# Patient Record
Sex: Male | Born: 1970 | Race: White | Hispanic: No | Marital: Single | Smoking: Never smoker
Health system: Southern US, Community
[De-identification: ages and names within clinical notes are randomized; demographics above are authoritative.]

## PROBLEM LIST (undated history)

## (undated) DIAGNOSIS — K219 Gastro-esophageal reflux disease without esophagitis: Secondary | ICD-10-CM

## (undated) DIAGNOSIS — R519 Headache, unspecified: Secondary | ICD-10-CM

## (undated) DIAGNOSIS — I10 Essential (primary) hypertension: Secondary | ICD-10-CM

## (undated) DIAGNOSIS — E785 Hyperlipidemia, unspecified: Secondary | ICD-10-CM

## (undated) DIAGNOSIS — G4733 Obstructive sleep apnea (adult) (pediatric): Secondary | ICD-10-CM

## (undated) HISTORY — DX: Obstructive sleep apnea (adult) (pediatric): G47.33

## (undated) HISTORY — DX: Headache, unspecified: R51.9

## (undated) HISTORY — PX: NASAL SINUS SURGERY: SHX719

## (undated) HISTORY — PX: APPENDECTOMY: SHX54

## (undated) HISTORY — PX: LUMBAR FUSION: SHX111

## (undated) HISTORY — PX: OTHER SURGICAL HISTORY: SHX169

## (undated) HISTORY — DX: Hyperlipidemia, unspecified: E78.5

---

## 2000-06-12 ENCOUNTER — Encounter: Payer: Self-pay | Admitting: Emergency Medicine

## 2000-06-12 ENCOUNTER — Emergency Department (HOSPITAL_COMMUNITY): Admission: EM | Admit: 2000-06-12 | Discharge: 2000-06-13 | Payer: Self-pay | Admitting: Emergency Medicine

## 2000-10-17 ENCOUNTER — Encounter: Payer: Self-pay | Admitting: Emergency Medicine

## 2000-10-17 ENCOUNTER — Emergency Department (HOSPITAL_COMMUNITY): Admission: EM | Admit: 2000-10-17 | Discharge: 2000-10-17 | Payer: Self-pay | Admitting: Unknown Physician Specialty

## 2001-04-12 ENCOUNTER — Emergency Department (HOSPITAL_COMMUNITY): Admission: EM | Admit: 2001-04-12 | Discharge: 2001-04-13 | Payer: Self-pay | Admitting: Emergency Medicine

## 2002-12-24 ENCOUNTER — Inpatient Hospital Stay (HOSPITAL_COMMUNITY): Admission: RE | Admit: 2002-12-24 | Discharge: 2002-12-26 | Payer: Self-pay | Admitting: Neurological Surgery

## 2002-12-24 ENCOUNTER — Encounter: Payer: Self-pay | Admitting: Neurological Surgery

## 2003-01-11 ENCOUNTER — Encounter: Admission: RE | Admit: 2003-01-11 | Discharge: 2003-01-11 | Payer: Self-pay | Admitting: Neurological Surgery

## 2003-01-11 ENCOUNTER — Encounter: Payer: Self-pay | Admitting: Neurological Surgery

## 2003-02-09 ENCOUNTER — Encounter: Admission: RE | Admit: 2003-02-09 | Discharge: 2003-02-09 | Payer: Self-pay | Admitting: Neurological Surgery

## 2004-07-08 ENCOUNTER — Emergency Department (HOSPITAL_COMMUNITY): Admission: EM | Admit: 2004-07-08 | Discharge: 2004-07-08 | Payer: Self-pay | Admitting: Emergency Medicine

## 2004-08-02 ENCOUNTER — Emergency Department (HOSPITAL_COMMUNITY): Admission: EM | Admit: 2004-08-02 | Discharge: 2004-08-02 | Payer: Self-pay | Admitting: Emergency Medicine

## 2004-08-03 ENCOUNTER — Emergency Department (HOSPITAL_COMMUNITY): Admission: EM | Admit: 2004-08-03 | Discharge: 2004-08-03 | Payer: Self-pay | Admitting: Family Medicine

## 2008-02-27 ENCOUNTER — Emergency Department (HOSPITAL_COMMUNITY): Admission: EM | Admit: 2008-02-27 | Discharge: 2008-02-27 | Payer: Self-pay | Admitting: Emergency Medicine

## 2008-03-21 ENCOUNTER — Emergency Department (HOSPITAL_COMMUNITY): Admission: EM | Admit: 2008-03-21 | Discharge: 2008-03-21 | Payer: Self-pay | Admitting: Emergency Medicine

## 2008-04-05 ENCOUNTER — Emergency Department (HOSPITAL_COMMUNITY): Admission: EM | Admit: 2008-04-05 | Discharge: 2008-04-05 | Payer: Self-pay | Admitting: Emergency Medicine

## 2010-08-18 NOTE — Op Note (Signed)
NAME:  Zachary Delacruz, Zachary Delacruz                           ACCOUNT NO.:  [id number]   MEDICAL RECORD NO.:  [id number]                   PATIENT TYPE:  INP   LOCATION:  2899                                 FACILITY:  [facility]   PHYSICIAN:  Tia Alert, MD                  DATE OF BIRTH:  1970/08/26   DATE OF PROCEDURE:  12/24/2002  DATE OF DISCHARGE:                                 OPERATIVE REPORT   PREOPERATIVE DIAGNOSES:  1. Degenerative disk disease, L5-S1.  2. Lumbar disk herniation L5-S1 on the right with right S1 radiculopathy.  3. Back pain.   POSTOPERATIVE DIAGNOSES:  1. Degenerative disk disease, L5-S1.  2. Lumbar disk herniation L5-S1 on the right with right S1 radiculopathy.  3. Back pain.   PROCEDURE:  1. Lumbar laminectomy, facetectomy and foraminotomy L5-S1 and diskectomy L5-     S1 on the left.  2. Posterior lumbar interbody fusion utilizing 10 x 26 mm Tangent allograft     bone wedges and locally harvest morselized autologous bone graft mixed     with VITOSS.  3. Nonsegmental fixation utilizing pedicle screw and rod instrumentation.   SURGEON:  Tia Alert, MD   ASSISTANT:  Reinaldo Meeker, M.D.   ANESTHESIA:  General endotracheal anesthesia.   COMPLICATIONS:  None.   INDICATIONS FOR PROCEDURE:  Zachary Delacruz is a 39 year old white male who was  referred to the  neurosurgery clinic with complaints of mechanical back pain  with right lower extremity pain in an S1 distribution. His back pain was  worse than his leg pain and was mechanical in nature. He tried medical  management  for quite some time without significant relief. He  had an MRI  which showed degenerative disk disease at L5-S1 with collapse at the disk  space  and a broad-based disk  herniation para central to the right with  compression of the right S1 nerve root. I recommended posterior lumbar  interbody fusion with instrumentation. He understood the  risks, benefits  and alternatives and wished to  proceed.   DESCRIPTION OF PROCEDURE:  The patient was brought to the operating room and  after the induction of adequate general endotracheal anesthesia he was  rolled in the prone position  on the Wilson frame and all pressure points  were padded. His lumbar region was prepped with Duraprep and then draped in  the usual sterile fashion. Then 10 mL of local anesthesia was injected.   Then a dorsal midline incision was made and was carried down to the  lumbosacral fascia. The fascia was opened and taken down in a subperiosteal  fashion to expose the L5-S1 interspace bilaterally. I dissected out and  found the transverse processes of L5 and S1  bilaterally. Interoperative  fluoroscopy was used to identify the correct level  and then a combination  left Leksell rongeurs and Kerrison punches were used  to remove the spinous  process and perform complete  laminectomy, hemi fasciectomy and bilateral  foraminotomies at L4-5.   The L4 and L5 nerve roots were decompressed and followed  out into their  foramina for quite some distance. Once the decompression was complete, I  coagulated the epidural venous vasculature at L5-S1 bilaterally. He had a  large disk  herniation at L5-S1 on the left which was subannular. He also  had a calcified  osteophyte in the midline.   I incised the disk space bilaterally  and performed an initial diskectomy  with curets and a pituitary rongeur. I then used sequential distraction  plugs of the _________ [employer] System to distract the disk space up to 10 mm.  We left the distraction pin on the patient's right side, and on the left  side prepared the endplates with the rotating cutter, round scraper and 10-  mm  cutting chisel and then tapped a 10 x 26 mm Tangent allograft bone wedge  into the interspace at L5-S1 on the left.   We then prepared the interspace on the right side in the same way utilizing  the rotating cutter, round scraper, and cutting chisel. We then  packed  the  midline  with autograft saved during the decompression which was  mixed with  VITOSS. We then tapped another 10 x 26 mm Tangent allograft bone wedge into  the interspace at L5-S1 on the right side.   Once the PLIF was completed we  turned our attention to the nonsegmental  fixation. We localized the pedicle screw entry zones at L5 and S1  bilaterally  under fluoroscopic guidance. We tapped these pedicles with a 5-  5 tap, palpated each pedicle to assure no break in the cortex and then used  65 x 45 mm pedicle screws in the pedicles of L5 bilaterally  and 65 x 35 mm  screws into the sacrum bilaterally.   Once these were in position  we  used a 40 mm prebent  rod and locked these  into position  with the locking capsule and high torque device. We then  placed a separate cross link  for added stability. We irrigated with copious  amounts of Bacitracin containing solution, dried all bleeding points with  bipolar cautery and then closed the muscle and then the fascia with  interrupted #1 Vicryl. We closed the subcutaneous tissue with 2-0 and 3-0  Vicryl and closed the skin with Benzoin and Steri-Strips. A medium Hemovac  drain was placed prior to closure of the musculature.   The drapes  were removed. A sterile dressing was applied. The patient was  awakened from general anesthesia and transported  to the recovery room in  stable condition. At the end of the procedure all sponge, instrument and  needle counts were correct.                                               Tia Alert, MD    DSJ/MEDQ  D:  12/24/2002  T:  12/25/2002  Job:  [id number]

## 2011-05-22 ENCOUNTER — Encounter (HOSPITAL_COMMUNITY): Payer: Self-pay | Admitting: *Deleted

## 2011-05-22 ENCOUNTER — Emergency Department (INDEPENDENT_AMBULATORY_CARE_PROVIDER_SITE_OTHER)
Admission: EM | Admit: 2011-05-22 | Discharge: 2011-05-22 | Disposition: A | Discharge status: Home / Self Care | Payer: Self-pay | Source: Home / Self Care | Attending: Family Medicine | Admitting: Family Medicine

## 2011-05-22 DIAGNOSIS — M7710 Lateral epicondylitis, unspecified elbow: Secondary | ICD-10-CM

## 2011-05-22 HISTORY — DX: Essential (primary) hypertension: I10

## 2011-05-22 HISTORY — DX: Gastro-esophageal reflux disease without esophagitis: K21.9

## 2011-05-22 MED ORDER — HYDROCODONE-ACETAMINOPHEN 5-325 MG PO TABS: ORAL_TABLET | ORAL | Status: AC

## 2011-05-22 MED ORDER — NAPROXEN 500 MG PO TABS: 500.0000 mg | ORAL_TABLET | Freq: Two times a day (BID) | ORAL | Status: AC

## 2011-05-22 NOTE — Discharge Instructions (Signed)
This is an inflammation in one of your tendons and its insertion point. This is most commonly due to overuse. Take the medications as directed, completing the full course of naproxen daily; do not skip a dose. Use the hydrocodone ONLY as needed for pain not controlled by the naproxen, as this is an inflammatory injury. Also, you may use a lateral epicondylitis, or tennis elbow, band, 2 - 4 cm in front of your elbow, while using your arm. Also, try switching hands or duties between the two arms to prevent overuse. If symptoms persist, or worsen in any way, such as numbness, tingling, weakness or loss of grip strength, please follow up with the Orthopaedic Office listed: [employer] [facility].

## 2011-05-22 NOTE — ED Notes (Signed)
Bilateral elbow pain X 2 weeks, the left is worse.  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

## 2011-05-22 NOTE — ED Provider Notes (Signed)
History     CSN: [id number]  Arrival date & time 05/22/11  1427   First MD Initiated Contact with Patient 05/22/11 1504      Chief Complaint  Patient presents with  . Elbow Pain    (Consider location/radiation/quality/duration/timing/severity/associated sxs/prior treatment) HPI Comments: Zachary Delacruz presents for evaluation of bilateral elbow pain with the left being worse in the right. 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 He notes that the pain is greatest over the lateral epicondyle of the left elbow. He denies any numbness, tingling, or weakness.  Patient is a 41 y.o. male presenting with extremity pain. The history is provided by the patient.  Extremity Pain This is a new problem. The problem occurs constantly. The problem has not changed since onset.The symptoms are aggravated by exertion. The symptoms are relieved by nothing.    Past Medical History  Diagnosis Date  . GERD (gastroesophageal reflux disease)   . Hypertension     Past Surgical History  Procedure Date  . Appendectomy   . Lumbar fusion     History reviewed. No pertinent family history.  History  Substance Use Topics  . Smoking status: Never Smoker   . Smokeless tobacco: Not on file  . Alcohol Use: No      Review of Systems  Constitutional: Negative.   HENT: Negative.   Eyes: Negative.   Respiratory: Negative.   Cardiovascular: Negative.   Gastrointestinal: Negative.   Genitourinary: Negative.   Musculoskeletal: Positive for myalgias and arthralgias.  Skin: Negative.   Neurological: Negative.     Allergies  Reglan  Home Medications   Current Outpatient Rx  Name Route Sig Dispense Refill  . OMEPRAZOLE 40 MG PO CPDR Oral Take 40 mg by mouth daily.    Marland Kitchen HYDROCODONE-ACETAMINOPHEN 5-325 MG PO TABS  Take one to two tablets every 4 to 6 hours as needed for pain 20 tablet 0  . NAPROXEN 500 MG PO TABS Oral Take 1  tablet (500 mg total) by mouth 2 (two) times daily. 30 tablet 0    BP 132/81  Pulse 88  Temp(Src) 98 F (36.7 C) (Oral)  Resp 16  SpO2 98%  Physical Exam  Nursing note and vitals reviewed. Constitutional: He is oriented to person, place, and time. He appears well-developed and well-nourished.  HENT:  Head: Normocephalic and atraumatic.  Eyes: EOM are normal.  Neck: Normal range of motion.  Pulmonary/Chest: Effort normal.  Musculoskeletal: Normal range of motion.       Right elbow: He exhibits normal range of motion and no swelling. tenderness found. Lateral epicondyle tenderness noted.       Left elbow: He exhibits normal range of motion and no swelling. tenderness found. Lateral epicondyle tenderness noted.       Arms: Neurological: He is alert and oriented to person, place, and time.  Skin: Skin is warm and dry.  Psychiatric: His behavior is normal.    ED Course  Procedures (including critical care time)  Labs Reviewed - No data to display No results found.   1. Lateral epicondylitis (tennis elbow)       MDM  rx given for naproxen 500 mg PO BID, hydrocodone PRN, and advised to obtain tennis elbow band; [employer] provided.        Richardo Priest, MD 05/22/11 [phone]

## 2011-10-18 DIAGNOSIS — I10 Essential (primary) hypertension: Secondary | ICD-10-CM | POA: Diagnosis present

## 2012-08-11 DIAGNOSIS — K219 Gastro-esophageal reflux disease without esophagitis: Secondary | ICD-10-CM | POA: Diagnosis present

## 2013-02-09 ENCOUNTER — Emergency Department (HOSPITAL_COMMUNITY): Payer: BC Managed Care – PPO

## 2013-02-09 ENCOUNTER — Encounter (HOSPITAL_COMMUNITY): Payer: Self-pay | Admitting: Emergency Medicine

## 2013-02-09 ENCOUNTER — Emergency Department (HOSPITAL_COMMUNITY)
Admission: EM | Admit: 2013-02-09 | Discharge: 2013-02-09 | Disposition: A | Discharge status: Home / Self Care | Payer: BC Managed Care – PPO | Attending: Emergency Medicine | Admitting: Emergency Medicine

## 2013-02-09 DIAGNOSIS — K219 Gastro-esophageal reflux disease without esophagitis: Secondary | ICD-10-CM | POA: Insufficient documentation

## 2013-02-09 DIAGNOSIS — I10 Essential (primary) hypertension: Secondary | ICD-10-CM | POA: Insufficient documentation

## 2013-02-09 DIAGNOSIS — Z79899 Other long term (current) drug therapy: Secondary | ICD-10-CM | POA: Insufficient documentation

## 2013-02-09 DIAGNOSIS — R0602 Shortness of breath: Secondary | ICD-10-CM | POA: Insufficient documentation

## 2013-02-09 DIAGNOSIS — R209 Unspecified disturbances of skin sensation: Secondary | ICD-10-CM | POA: Insufficient documentation

## 2013-02-09 DIAGNOSIS — R0789 Other chest pain: Secondary | ICD-10-CM

## 2013-02-09 DIAGNOSIS — R42 Dizziness and giddiness: Secondary | ICD-10-CM | POA: Insufficient documentation

## 2013-02-09 LAB — BASIC METABOLIC PANEL
BUN: 16 mg/dL (ref 6–23)
CO2: 23 mEq/L (ref 19–32)
Creatinine, Ser: 1.03 mg/dL (ref 0.50–1.35)
Glucose, Bld: 101 mg/dL — ABNORMAL HIGH (ref 70–99)
Sodium: 139 mEq/L (ref 135–145)

## 2013-02-09 LAB — CBC
Hemoglobin: 15.8 g/dL (ref 13.0–17.0)
MCV: 87.3 fL (ref 78.0–100.0)
RDW: 13.1 % (ref 11.5–15.5)

## 2013-02-09 MED ORDER — ACETAMINOPHEN 325 MG PO TABS
975.0000 mg | ORAL_TABLET | Freq: Once | ORAL | Status: AC
  Administered 2013-02-09: 975 mg via ORAL
  Filled 2013-02-09: qty 3

## 2013-02-09 MED ORDER — HYDROCODONE-ACETAMINOPHEN 5-325 MG PO TABS: 1.0000 | ORAL_TABLET | ORAL | Status: DC | PRN

## 2013-02-09 NOTE — ED Provider Notes (Addendum)
CSN: [id number]     Arrival date & time 02/09/13  1352 History   First MD Initiated Contact with Patient 02/09/13 1531     Chief Complaint  Patient presents with  . Chest Pain  . Dizziness   (Consider location/radiation/quality/duration/timing/severity/associated sxs/prior Treatment) HPI Complains of left anterior chest pain gradual onset pleuritic in nature with tingling in his left arm onset 7:30 AM today. Pain not made worse with exertion. No sweatiness no other associated symptoms pain is slightly improved now without treatment.. Admits to mild shortness of breath lasting for a few minutes. No nausea denies dizziness no other complaint no treatment prior to coming here. Past Medical History  Diagnosis Date  . GERD (gastroesophageal reflux disease)   . Hypertension    cardiac risk factors hypertension otherwise negative Past Surgical History  Procedure Laterality Date  . Appendectomy    . Lumbar fusion     No family history on file. History  Substance Use Topics  . Smoking status: Never Smoker   . Smokeless tobacco: Not on file  . Alcohol Use: No    Review of Systems  Respiratory: Positive for shortness of breath.   Cardiovascular: Positive for chest pain.  All other systems reviewed and are negative.    Allergies  Metoclopramide hcl  Home Medications   Current Outpatient Rx  Name  Route  Sig  Dispense  Refill  . lisinopril (PRINIVIL,ZESTRIL) 20 MG tablet   Oral   Take 20 mg by mouth daily.         Marland Kitchen omeprazole (PRILOSEC) 40 MG capsule   Oral   Take 40 mg by mouth daily.          BP 123/82  Pulse 76  Temp(Src) 97.5 F (36.4 C) (Oral)  Resp 15  Wt 215 lb (97.523 kg)  SpO2 100% Physical Exam  Nursing note and vitals reviewed. Constitutional: He appears well-developed and well-nourished.  HENT:  Head: Normocephalic and atraumatic.  Eyes: Conjunctivae are normal. Pupils are equal, round, and reactive to light.  Neck: Neck supple. No tracheal  deviation present. No thyromegaly present.  Cardiovascular: Normal rate and regular rhythm.   No murmur heard. Pulmonary/Chest: Effort normal and breath sounds normal.  Chest wall is tender anteriorly. Pain is reproduced by forcible abduction of left shoulder.  Abdominal: Soft. Bowel sounds are normal. He exhibits no distension. There is no tenderness.  Musculoskeletal: Normal range of motion. He exhibits no edema and no tenderness.  Neurological: He is alert. Coordination normal.  Skin: Skin is warm and dry. No rash noted.  Psychiatric: He has a normal mood and affect.    ED Course  Procedures (including critical care time) Labs Review Labs Reviewed  CBC - Abnormal; Notable for the following:    WBC 11.7 (*)    MCHC 37.6 (*)    Platelets 147 (*)    All other components within normal limits  BASIC METABOLIC PANEL - Abnormal; Notable for the following:    Glucose, Bld 101 (*)    GFR calc non Af Amer 87 (*)    All other components within normal limits  PRO B NATRIURETIC PEPTIDE  POCT I-STAT TROPONIN I   Imaging Review Dg Chest 2 View  02/09/2013   CLINICAL DATA:  Chest pain. Dizziness.  EXAM: CHEST  2 VIEW  COMPARISON:  Chest radiograph 04/09/2012  FINDINGS: Stable cardiac and mediastinal contours. No consolidative pulmonary opacities. No pleural effusion or pneumothorax. Regional skeleton is unremarkable.  IMPRESSION: No acute cardiopulmonary  process.   Electronically Signed   By: Annia Belt M.D.   On: 02/09/2013 15:24    EKG Interpretation     Ventricular Rate:  99 PR Interval:  156 QRS Duration: 74 QT Interval:  332 QTC Calculation: 426 R Axis:   15 Text Interpretation:  Normal sinus rhythm Normal ECG No significant change since last tracing           Results for orders placed during the hospital encounter of 02/09/13  CBC      Result Value Range   WBC 11.7 (*) 4.0 - 10.5 K/uL   RBC 4.81  4.22 - 5.81 MIL/uL   Hemoglobin 15.8  13.0 - 17.0 g/dL   HCT 95.6   21.3 - 08.6 %   MCV 87.3  78.0 - 100.0 fL   MCH 32.8  26.0 - 34.0 pg   MCHC 37.6 (*) 30.0 - 36.0 g/dL   RDW 57.8  46.9 - 62.9 %   Platelets 147 (*) 150 - 400 K/uL  BASIC METABOLIC PANEL      Result Value Range   Sodium 139  135 - 145 mEq/L   Potassium 4.0  3.5 - 5.1 mEq/L   Chloride 105  96 - 112 mEq/L   CO2 23  19 - 32 mEq/L   Glucose, Bld 101 (*) 70 - 99 mg/dL   BUN 16  6 - 23 mg/dL   Creatinine, Ser 5.28  0.50 - 1.35 mg/dL   Calcium 9.7  8.4 - 41.3 mg/dL   GFR calc non Af Amer 87 (*) >90 mL/min   GFR calc Af Amer >90  >90 mL/min  PRO B NATRIURETIC PEPTIDE      Result Value Range   Pro B Natriuretic peptide (BNP) 36.5  0 - 125 pg/mL  POCT I-STAT TROPONIN I      Result Value Range   Troponin i, poc 0.00  0.00 - 0.08 ng/mL   Comment 3            Dg Chest 2 View  02/09/2013   CLINICAL DATA:  Chest pain. Dizziness.  EXAM: CHEST  2 VIEW  COMPARISON:  Chest radiograph 04/09/2012  FINDINGS: Stable cardiac and mediastinal contours. No consolidative pulmonary opacities. No pleural effusion or pneumothorax. Regional skeleton is unremarkable.  IMPRESSION: No acute cardiopulmonary process.   Electronically Signed   By: Annia Belt M.D.   On: 02/09/2013 15:24    Chest xray viewed by me MDM  No diagnosis found. Heart score  equals 1 patient is percneg.  Symptoms atypical for ACS and PE.  I felt can f/u ith PMD as out pt . Negative troponin after 6 hours of symptoms Plan prescription Norco Diagnosis atypical chest pain    Doug Sou, MD 02/09/13 1653  Doug Sou, MD 02/09/13 1736

## 2013-02-09 NOTE — ED Notes (Signed)
Pt is here with chest pain that started 0730 to left chest and radiates to left shoulder and arm.  Reports sob.  No nausea

## 2015-03-06 ENCOUNTER — Emergency Department (INDEPENDENT_AMBULATORY_CARE_PROVIDER_SITE_OTHER)
Admission: EM | Admit: 2015-03-06 | Discharge: 2015-03-06 | Disposition: A | Discharge status: Home / Self Care | Payer: BLUE CROSS/BLUE SHIELD | Source: Home / Self Care

## 2015-03-06 ENCOUNTER — Emergency Department (INDEPENDENT_AMBULATORY_CARE_PROVIDER_SITE_OTHER): Payer: BLUE CROSS/BLUE SHIELD

## 2015-03-06 ENCOUNTER — Encounter (HOSPITAL_COMMUNITY): Payer: Self-pay | Admitting: *Deleted

## 2015-03-06 DIAGNOSIS — J018 Other acute sinusitis: Secondary | ICD-10-CM

## 2015-03-06 MED ORDER — FLUTICASONE PROPIONATE 50 MCG/ACT NA SUSP: 2.0000 | Freq: Every day | NASAL | Status: DC

## 2015-03-06 MED ORDER — AMOXICILLIN-POT CLAVULANATE 875-125 MG PO TABS: 1.0000 | ORAL_TABLET | Freq: Two times a day (BID) | ORAL | Status: DC

## 2015-03-06 NOTE — Discharge Instructions (Signed)
You have a sinus infection. Take Augmentin as prescribed. Use Flonase daily for the next week. You should see improvement in the next 2-3 days. Follow-up as needed.

## 2015-03-06 NOTE — ED Notes (Signed)
Pt  Reports  Symptoms  Of    Cough  /   Congested     As  Well  As     Well    As   Drainage  And  Body  Aches   X  10  Days    Pt  Reports      Chest  Pain  When he  Coughs       Symptoms  Not releived  By otc meds

## 2015-03-06 NOTE — ED Provider Notes (Signed)
CSN: [id number]     Arrival date & time 03/06/15  1648 History   None    Chief Complaint  Patient presents with  . Cough   (Consider location/radiation/quality/duration/timing/severity/associated sxs/prior Treatment) HPI  He is a 44 year old man here for evaluation of cough. He states his symptoms started about 2 weeks ago with fever, body aches, and cough. He took some over-the-counter medications and seemed to be improving, but then worsened again. He reports body aches, worsening cough, foul-smelling nasal drainage only when he bends forward.  He also reports chest pain around his rib cage that is worsened with coughing. No nausea or vomiting. He reports subjective fevers over the last few days. He has taken his temperature and it was around 100.  Past Medical History  Diagnosis Date  . GERD (gastroesophageal reflux disease)   . Hypertension    Past Surgical History  Procedure Laterality Date  . Appendectomy    . Lumbar fusion     History reviewed. No pertinent family history. Social History  Substance Use Topics  . Smoking status: Never Smoker   . Smokeless tobacco: None  . Alcohol Use: No    Review of Systems As in history of present illness Allergies  Metoclopramide hcl  Home Medications   Prior to Admission medications   Medication Sig Start Date End Date Taking? Authorizing Provider  amoxicillin-clavulanate (AUGMENTIN) 875-125 MG tablet Take 1 tablet by mouth 2 (two) times daily. 03/06/15   Charm RingsErin J Ariellah Faust, MD  fluticasone (FLONASE) 50 MCG/ACT nasal spray Place 2 sprays into both nostrils daily. 03/06/15   Charm RingsErin J Kirin Pastorino, MD  lisinopril (PRINIVIL,ZESTRIL) 20 MG tablet Take 20 mg by mouth daily.    Historical Provider, MD  omeprazole (PRILOSEC) 40 MG capsule Take 40 mg by mouth daily.    Historical Provider, MD   Meds Ordered and Administered this Visit  Medications - No data to display  BP 132/88 mmHg  Pulse 84  Temp(Src) 98 F (36.7 C) (Oral)  SpO2 97% No data  found.   Physical Exam  Constitutional: He is oriented to person, place, and time. He appears well-developed and well-nourished. No distress.  HENT:  Mouth/Throat: No oropharyngeal exudate.  He has moderate postnasal drainage. Nasal mucosa is slightly erythematous. No sinus tenderness.  Neck: Neck supple.  Cardiovascular: Normal rate, regular rhythm and normal heart sounds.   No murmur heard. Pulmonary/Chest: Effort normal and breath sounds normal. No respiratory distress. He has no wheezes. He has no rales.  Lymphadenopathy:    He has no cervical adenopathy.  Neurological: He is alert and oriented to person, place, and time.    ED Course  Procedures (including critical care time)  Labs Review Labs Reviewed - No data to display  Imaging Review Dg Chest 2 View  03/06/2015  CLINICAL DATA:  Cough, congestion, fever, body aches x 10 days EXAM: CHEST  2 VIEW COMPARISON:  CT chest dated 10/11/2013 FINDINGS: Lungs are clear.  No pleural effusion or pneumothorax. The heart is normal in size. Mild degenerative changes of the visualized thoracolumbar spine. IMPRESSION: No evidence of acute cardiopulmonary disease. Electronically Signed   By: Charline BillsSriyesh  Krishnan M.D.   On: 03/06/2015 18:09     MDM   1. Other acute sinusitis    Treat with Augmentin and Flonase. Follow-up as needed.    Charm RingsErin J Cleatis Fandrich, MD 03/06/15 [phone]

## 2016-12-10 DIAGNOSIS — R079 Chest pain, unspecified: Secondary | ICD-10-CM

## 2016-12-10 DIAGNOSIS — I10 Essential (primary) hypertension: Secondary | ICD-10-CM

## 2016-12-10 DIAGNOSIS — R0602 Shortness of breath: Secondary | ICD-10-CM

## 2016-12-11 ENCOUNTER — Encounter (HOSPITAL_COMMUNITY): Payer: Self-pay | Admitting: Internal Medicine

## 2016-12-11 ENCOUNTER — Ambulatory Visit (HOSPITAL_COMMUNITY): Admit: 2016-12-11 | Payer: BLUE CROSS/BLUE SHIELD | Admitting: Interventional Cardiology

## 2016-12-11 ENCOUNTER — Observation Stay (HOSPITAL_COMMUNITY)
Admission: AD | Admit: 2016-12-11 | Discharge: 2016-12-12 | Disposition: A | Discharge status: Home / Self Care | Payer: Self-pay | Source: Other Acute Inpatient Hospital | Attending: Cardiovascular Disease | Admitting: Cardiovascular Disease

## 2016-12-11 ENCOUNTER — Encounter (HOSPITAL_COMMUNITY)
Admission: AD | Disposition: A | Discharge status: Home / Self Care | Payer: Self-pay | Source: Other Acute Inpatient Hospital | Attending: Cardiovascular Disease

## 2016-12-11 ENCOUNTER — Other Ambulatory Visit (HOSPITAL_COMMUNITY): Payer: Self-pay

## 2016-12-11 DIAGNOSIS — I200 Unstable angina: Secondary | ICD-10-CM

## 2016-12-11 DIAGNOSIS — I10 Essential (primary) hypertension: Secondary | ICD-10-CM

## 2016-12-11 DIAGNOSIS — I25110 Atherosclerotic heart disease of native coronary artery with unstable angina pectoris: Principal | ICD-10-CM | POA: Insufficient documentation

## 2016-12-11 DIAGNOSIS — K219 Gastro-esophageal reflux disease without esophagitis: Secondary | ICD-10-CM

## 2016-12-11 DIAGNOSIS — Z7951 Long term (current) use of inhaled steroids: Secondary | ICD-10-CM | POA: Insufficient documentation

## 2016-12-11 DIAGNOSIS — Z7982 Long term (current) use of aspirin: Secondary | ICD-10-CM | POA: Insufficient documentation

## 2016-12-11 DIAGNOSIS — I2510 Atherosclerotic heart disease of native coronary artery without angina pectoris: Secondary | ICD-10-CM

## 2016-12-11 DIAGNOSIS — R9439 Abnormal result of other cardiovascular function study: Secondary | ICD-10-CM

## 2016-12-11 HISTORY — PX: LEFT HEART CATH AND CORONARY ANGIOGRAPHY: CATH118249

## 2016-12-11 LAB — LIPID PANEL
CHOL/HDL RATIO: 5.3 ratio
CHOLESTEROL: 144 mg/dL (ref 0–200)
HDL: 27 mg/dL — ABNORMAL LOW (ref >40)
LDL Cholesterol: 81 mg/dL (ref 0–99)
TRIGLYCERIDES: 181 mg/dL — AB (ref <150)
VLDL: 36 mg/dL (ref 0–40)

## 2016-12-11 LAB — COMPREHENSIVE METABOLIC PANEL
ALK PHOS: 59 U/L (ref 38–126)
ALT: 30 U/L (ref 17–63)
AST: 28 U/L (ref 15–41)
Albumin: 3.8 g/dL (ref 3.5–5.0)
Anion gap: 8 (ref 5–15)
BUN: 11 mg/dL (ref 6–20)
CALCIUM: 8.9 mg/dL (ref 8.9–10.3)
CO2: 23 mmol/L (ref 22–32)
CREATININE: 1.1 mg/dL (ref 0.61–1.24)
Chloride: 107 mmol/L (ref 101–111)
Glucose, Bld: 98 mg/dL (ref 65–99)
Potassium: 3.7 mmol/L (ref 3.5–5.1)
Sodium: 138 mmol/L (ref 135–145)
Total Bilirubin: 1.5 mg/dL — ABNORMAL HIGH (ref 0.3–1.2)
Total Protein: 6.3 g/dL — ABNORMAL LOW (ref 6.5–8.1)

## 2016-12-11 LAB — TROPONIN I
Troponin I: <0.03 ng/mL (ref <0.03)
Troponin I: <0.03 ng/mL (ref <0.03)

## 2016-12-11 LAB — TSH: TSH: 2.926 u[IU]/mL (ref 0.350–4.500)

## 2016-12-11 LAB — CBC
HCT: 43.9 % (ref 39.0–52.0)
HEMOGLOBIN: 15.9 g/dL (ref 13.0–17.0)
MCH: 32.1 pg (ref 26.0–34.0)
MCHC: 36.2 g/dL — ABNORMAL HIGH (ref 30.0–36.0)
MCV: 88.7 fL (ref 78.0–100.0)
PLATELETS: 126 10*3/uL — AB (ref 150–400)
RBC: 4.95 MIL/uL (ref 4.22–5.81)
RDW: 13.3 % (ref 11.5–15.5)
WBC: 7 10*3/uL (ref 4.0–10.5)

## 2016-12-11 LAB — PROTIME-INR
INR: 1.04
PROTHROMBIN TIME: 13.5 s (ref 11.4–15.2)

## 2016-12-11 LAB — HIV ANTIBODY (ROUTINE TESTING W REFLEX): HIV SCREEN 4TH GENERATION: NONREACTIVE

## 2016-12-11 LAB — HEMOGLOBIN A1C
Hgb A1c MFr Bld: 4.5 % — ABNORMAL LOW (ref 4.8–5.6)
MEAN PLASMA GLUCOSE: 82.45 mg/dL

## 2016-12-11 LAB — BRAIN NATRIURETIC PEPTIDE: B NATRIURETIC PEPTIDE 5: 20.8 pg/mL (ref 0.0–100.0)

## 2016-12-11 SURGERY — LEFT HEART CATH AND CORONARY ANGIOGRAPHY
Anesthesia: LOCAL

## 2016-12-11 MED ORDER — IOPAMIDOL (ISOVUE-370) INJECTION 76%
INTRAVENOUS | Status: AC
  Filled 2016-12-11: qty 100

## 2016-12-11 MED ORDER — SODIUM CHLORIDE 0.9 % WEIGHT BASED INFUSION
1.0000 mL/kg/h | INTRAVENOUS | Status: DC
  Administered 2016-12-11: 1 mL/kg/h via INTRAVENOUS

## 2016-12-11 MED ORDER — SODIUM CHLORIDE 0.9% FLUSH: 3.0000 mL | INTRAVENOUS | Status: DC | PRN

## 2016-12-11 MED ORDER — LIDOCAINE HCL 2 % IJ SOLN
INTRAMUSCULAR | Status: AC
  Filled 2016-12-11: qty 10

## 2016-12-11 MED ORDER — ATORVASTATIN CALCIUM 20 MG PO TABS
20.0000 mg | ORAL_TABLET | Freq: Every day | ORAL | Status: DC
  Administered 2016-12-11: 20 mg via ORAL
  Filled 2016-12-11: qty 1

## 2016-12-11 MED ORDER — OXYCODONE HCL 5 MG PO TABS: 5.0000 mg | ORAL_TABLET | ORAL | Status: DC | PRN

## 2016-12-11 MED ORDER — HEPARIN SODIUM (PORCINE) 5000 UNIT/ML IJ SOLN
5000.0000 [IU] | Freq: Three times a day (TID) | INTRAMUSCULAR | Status: DC
  Administered 2016-12-11 – 2016-12-12 (×2): 5000 [IU] via SUBCUTANEOUS
  Filled 2016-12-11 (×2): qty 1

## 2016-12-11 MED ORDER — SODIUM CHLORIDE 0.9% FLUSH
3.0000 mL | Freq: Two times a day (BID) | INTRAVENOUS | Status: DC
  Administered 2016-12-11: 3 mL via INTRAVENOUS

## 2016-12-11 MED ORDER — FENTANYL CITRATE (PF) 100 MCG/2ML IJ SOLN
INTRAMUSCULAR | Status: AC
  Filled 2016-12-11: qty 2

## 2016-12-11 MED ORDER — ASPIRIN 81 MG PO CHEW: 81.0000 mg | CHEWABLE_TABLET | Freq: Every day | ORAL | Status: DC

## 2016-12-11 MED ORDER — ASPIRIN EC 81 MG PO TBEC
81.0000 mg | DELAYED_RELEASE_TABLET | Freq: Every day | ORAL | Status: DC
  Administered 2016-12-12: 81 mg via ORAL
  Filled 2016-12-11: qty 1

## 2016-12-11 MED ORDER — LISINOPRIL 20 MG PO TABS
20.0000 mg | ORAL_TABLET | Freq: Every day | ORAL | Status: DC
  Administered 2016-12-11 – 2016-12-12 (×2): 20 mg via ORAL
  Filled 2016-12-11 (×2): qty 1

## 2016-12-11 MED ORDER — SODIUM CHLORIDE 0.9 % WEIGHT BASED INFUSION
3.0000 mL/kg/h | INTRAVENOUS | Status: DC
  Administered 2016-12-11: 3 mL/kg/h via INTRAVENOUS

## 2016-12-11 MED ORDER — HEPARIN SODIUM (PORCINE) 1000 UNIT/ML IJ SOLN
INTRAMUSCULAR | Status: AC
  Filled 2016-12-11: qty 1

## 2016-12-11 MED ORDER — SODIUM CHLORIDE 0.9 % WEIGHT BASED INFUSION
1.0000 mL/kg/h | INTRAVENOUS | Status: AC
  Administered 2016-12-11: 1 mL/kg/h via INTRAVENOUS

## 2016-12-11 MED ORDER — SODIUM CHLORIDE 0.9 % IV SOLN: 250.0000 mL | INTRAVENOUS | Status: DC | PRN

## 2016-12-11 MED ORDER — ONDANSETRON HCL 4 MG/2ML IJ SOLN: 4.0000 mg | Freq: Four times a day (QID) | INTRAMUSCULAR | Status: DC | PRN

## 2016-12-11 MED ORDER — FENTANYL CITRATE (PF) 100 MCG/2ML IJ SOLN
INTRAMUSCULAR | Status: DC | PRN
Start: 2016-12-11 — End: 2016-12-11
  Administered 2016-12-11 (×2): 50 ug via INTRAVENOUS

## 2016-12-11 MED ORDER — MIDAZOLAM HCL 2 MG/2ML IJ SOLN
INTRAMUSCULAR | Status: DC | PRN
  Administered 2016-12-11 (×2): 1 mg via INTRAVENOUS

## 2016-12-11 MED ORDER — VERAPAMIL HCL 2.5 MG/ML IV SOLN
INTRAVENOUS | Status: DC | PRN
  Administered 2016-12-11: 11:00:00 via INTRA_ARTERIAL

## 2016-12-11 MED ORDER — ACETAMINOPHEN 325 MG PO TABS
650.0000 mg | ORAL_TABLET | ORAL | Status: DC | PRN
  Administered 2016-12-11: 650 mg via ORAL
  Filled 2016-12-11: qty 2

## 2016-12-11 MED ORDER — SODIUM CHLORIDE 0.9% FLUSH
3.0000 mL | Freq: Two times a day (BID) | INTRAVENOUS | Status: DC
  Administered 2016-12-11 – 2016-12-12 (×2): 3 mL via INTRAVENOUS

## 2016-12-11 MED ORDER — PANTOPRAZOLE SODIUM 40 MG PO TBEC
40.0000 mg | DELAYED_RELEASE_TABLET | Freq: Every day | ORAL | Status: DC
  Administered 2016-12-11 – 2016-12-12 (×2): 40 mg via ORAL
  Filled 2016-12-11 (×2): qty 1

## 2016-12-11 MED ORDER — FLUTICASONE PROPIONATE 50 MCG/ACT NA SUSP
2.0000 | Freq: Every day | NASAL | Status: DC
  Filled 2016-12-11: qty 16

## 2016-12-11 MED ORDER — HEPARIN SODIUM (PORCINE) 1000 UNIT/ML IJ SOLN
INTRAMUSCULAR | Status: DC | PRN
  Administered 2016-12-11: 5000 [IU] via INTRAVENOUS

## 2016-12-11 MED ORDER — AMOXICILLIN-POT CLAVULANATE 875-125 MG PO TABS: 1.0000 | ORAL_TABLET | Freq: Two times a day (BID) | ORAL | Status: DC

## 2016-12-11 MED ORDER — VERAPAMIL HCL 2.5 MG/ML IV SOLN
INTRAVENOUS | Status: AC
  Filled 2016-12-11: qty 2

## 2016-12-11 MED ORDER — HEPARIN (PORCINE) IN NACL 2-0.9 UNIT/ML-% IJ SOLN
INTRAMUSCULAR | Status: AC | PRN
  Administered 2016-12-11: 1000 mL

## 2016-12-11 MED ORDER — TRAZODONE HCL 50 MG PO TABS
25.0000 mg | ORAL_TABLET | Freq: Every evening | ORAL | Status: DC | PRN
  Administered 2016-12-11: 25 mg via ORAL
  Filled 2016-12-11: qty 1

## 2016-12-11 MED ORDER — HEPARIN (PORCINE) IN NACL 2-0.9 UNIT/ML-% IJ SOLN
INTRAMUSCULAR | Status: AC
  Filled 2016-12-11: qty 1000

## 2016-12-11 MED ORDER — LIDOCAINE HCL (PF) 1 % IJ SOLN
INTRAMUSCULAR | Status: DC | PRN
  Administered 2016-12-11: 2 mL via INTRADERMAL

## 2016-12-11 MED ORDER — IOPAMIDOL (ISOVUE-370) INJECTION 76%
INTRAVENOUS | Status: DC | PRN
  Administered 2016-12-11: 40 mL via INTRA_ARTERIAL

## 2016-12-11 MED ORDER — HEPARIN (PORCINE) IN NACL 100-0.45 UNIT/ML-% IJ SOLN
1300.0000 [IU]/h | INTRAMUSCULAR | Status: DC
  Administered 2016-12-11: 1300 [IU]/h via INTRAVENOUS
  Filled 2016-12-11: qty 250

## 2016-12-11 MED ORDER — MIDAZOLAM HCL 2 MG/2ML IJ SOLN
INTRAMUSCULAR | Status: AC
  Filled 2016-12-11: qty 2

## 2016-12-11 MED ORDER — ATORVASTATIN CALCIUM 80 MG PO TABS: 80.0000 mg | ORAL_TABLET | Freq: Every day | ORAL | Status: DC

## 2016-12-11 MED ORDER — ASPIRIN 81 MG PO CHEW
81.0000 mg | CHEWABLE_TABLET | ORAL | Status: AC
  Administered 2016-12-11: 81 mg via ORAL
  Filled 2016-12-11: qty 1

## 2016-12-11 SURGICAL SUPPLY — 12 items
CATH INFINITI 5 FR JL3.5 (CATHETERS) ×1 IMPLANT
CATH INFINITI JR4 5F (CATHETERS) ×1 IMPLANT
COVER PRB 48X5XTLSCP FOLD TPE (BAG) IMPLANT
COVER PROBE 5X48 (BAG) ×2
DEVICE RAD COMP TR BAND LRG (VASCULAR PRODUCTS) ×1 IMPLANT
GLIDESHEATH SLEND A-KIT 6F 22G (SHEATH) ×1 IMPLANT
GUIDEWIRE INQWIRE 1.5J.035X260 (WIRE) IMPLANT
INQWIRE 1.5J .035X260CM (WIRE) ×2
KIT HEART LEFT (KITS) ×2 IMPLANT
PACK CARDIAC CATHETERIZATION (CUSTOM PROCEDURE TRAY) ×2 IMPLANT
TRANSDUCER W/STOPCOCK (MISCELLANEOUS) ×2 IMPLANT
TUBING CIL FLEX 10 FLL-RA (TUBING) ×2 IMPLANT

## 2016-12-11 NOTE — Interval H&P Note (Signed)
Cath Lab Visit (complete for each Cath Lab visit)  Clinical Evaluation Leading to the Procedure:   ACS: Yes.    Non-ACS:    Anginal Classification: CCS IV  Anti-ischemic medical therapy: Minimal Therapy (1 class of medications)  Non-Invasive Test Results: Low-risk stress test findings: cardiac mortality <1%/year  Prior CABG: No previous CABG      History and Physical Interval Note:  12/11/2016 11:07 AM  Zachary Delacruz  has presented today for surgery, with the diagnosis of cp  The various methods of treatment have been discussed with the patient and family. After consideration of risks, benefits and other options for treatment, the patient has consented to  Procedure(s): LEFT HEART CATH AND CORONARY ANGIOGRAPHY (N/A) as a surgical intervention .  The patient's history has been reviewed, patient examined, no change in status, stable for surgery.  I have reviewed the patient's chart and labs.  Questions were answered to the patient's satisfaction.     Lyn RecordsHenry W Roddie Riegler III

## 2016-12-11 NOTE — CV Procedure (Signed)
   Normal coronary arteries.  Normal LV function.

## 2016-12-11 NOTE — Progress Notes (Addendum)
ANTICOAGULATION CONSULT NOTE - Initial Consult  Pharmacy Consult for heparin  Indication: chest pain/ACS  Allergies  Allergen Reactions  . Metoclopramide Hcl Other (See Comments)    tachycardia    Patient Measurements: Height: 6\' 2"  (188 cm) Weight: 222 lb 11.2 oz (101 kg) IBW/kg (Calculated) : 82.2 Heparin Dosing Weight: 101 kg   Vital Signs: Temp: 97.5 F (36.4 C) (09/11 0352) BP: 125/81 (09/11 0352) Pulse Rate: 72 (09/11 0352)  Labs: No results for input(s): HGB, HCT, PLT, APTT, LABPROT, INR, HEPARINUNFRC, HEPRLOWMOCWT, CREATININE, CKTOTAL, CKMB, TROPONINI in the last 72 hours.  CrCl cannot be calculated (Patient's most recent lab result is older than the maximum 21 days allowed.).  Medical History: Past Medical History:  Diagnosis Date  . GERD (gastroesophageal reflux disease)   . Hypertension     Assessment: 46 yo [facility] of [facility]. [facility] and patient received Lovenox 1mg /kg (100 mg) once at 0125 on 9/10.   No oral anticoagulation PTA per patient. [place] [facility] on 9/9, with Hgb 16.7 and plt 157. No s/s bleeding noted.   Will hold bolus in setting of recent therapeutic dose of lovenox.   Goal of Therapy:  Heparin level 0.3-0.7 units/ml Monitor platelets by anticoagulation protocol: Yes   Plan:  Start heparin gtt at 1300 units/hr Heparin level in 6 hrs Daily heparin level and CBC Monitor for s/s bleeding   York CeriseKatherine Cook, PharmD Clinical Pharmacist 12/11/16 6:54 AM

## 2016-12-11 NOTE — H&P (Signed)
CARDIOLOGY OBSERVATION HISTORY AND PHYSICAL EXAMINATION NOTE  Patient ID: Zachary Delacruz MRN: [id number], DOB/AGE: 48/07/1970   Admit date: 12/11/2016   Primary Physician: System, Provider Not In Primary Cardiologist: new  Reason for admission: chest pain  HPI: This is a 46 y.o. white male with no prior history of CAD  But h/o HTN and GERD presented with chest pain to [facility].  Patient developed CP 2 d ago, sudden onset, left sided, non radiating, 10/10 in intensity. He never experience similar CP before. Associated with diaphoresis. CP is persistent and did not respond to NTG. Different from hig GERD.  At [facility], CTA for PE was -ve, EKG showed NSR and LAFB. Exercise stress nuclear study showed small anteroseptal reversible defect. Patient sent here for cardiac cath. Continues to have CP. Able to perform 10.2 METs without limiting angina.  Hemodynamically stable and -ve troponin -ve EKG  Ischemic changes.   Of note, patient has been [facility] for costochondritis in [facility].   Problem List: Past Medical History:  Diagnosis Date  . GERD (gastroesophageal reflux disease)   . Hypertension     Past Surgical History:  Procedure Laterality Date  . APPENDECTOMY    . LUMBAR FUSION       Allergies:  Allergies  Allergen Reactions  . Metoclopramide Hcl Other (See Comments)    tachycardia     Home Medications Current Facility-Administered Medications  Medication Dose Route Frequency Provider Last Rate Last Dose  . aspirin EC tablet 81 mg  81 mg Oral Daily Timoteo ExposeQureshi, Waqas T, MD      . atorvastatin (LIPITOR) tablet 80 mg  80 mg Oral q1800 Timoteo ExposeQureshi, Waqas T, MD      . fluticasone [facility](FLONASE) 50 MCG/ACT nasal spray 2 spray  2 spray Each Nare Daily Timoteo ExposeQureshi, Waqas T, MD      . heparin ADULT infusion 100 units/mL (25000 units/27450mL sodium chloride 0.45%)  1,300 Units/hr Intravenous Continuous Cherre HugerCook, Katherine R, [facility]      . lisinopril (PRINIVIL,ZESTRIL) tablet 20 mg  20 mg  Oral Daily Timoteo ExposeQureshi, Waqas T, MD      . pantoprazole (PROTONIX) EC tablet 40 mg  40 mg Oral Daily Joellyn RuedQureshi, Waqas T, MD         Family History  Problem Relation Age of Onset  . Premature CHD Neg Hx      Social History   Social History  . Marital status: Single    Spouse name: N/A  . Number of children: N/A  . Years of education: N/A   Occupational History  . Not on file.   Social History Main Topics  . Smoking status: Never Smoker  . Smokeless tobacco: Not on file  . Alcohol use No  . Drug use: No  . Sexual activity: Not on file   Other Topics Concern  . Not on file   Social History Narrative  . No narrative on file     Review of Systems: General: negative for chills, fever, night sweats or weight changes.  Cardiovascular: chest pain negative for dyspnea on exertion, edema, orthopnea, palpitations, paroxysmal nocturnal dyspnea or SOB Dermatological: negative for rash Respiratory: negative for cough or wheezing Urologic: negative for hematuria Abdominal: negative for nausea, vomiting, diarrhea, bright red blood per rectum, melena, or hematemesis Neurologic: negative for visual changes, syncope, or dizziness Endocrine: no diabetes, no hypothyroidism Immunological: no lymph adenopathy Psych: non homicidal/suicidal  Physical Exam: Vitals: BP 125/81   Pulse 72   Temp (!) 97.5 F (  36.4 C)   Resp 20   Ht  (1.88 m)   Wt 101 kg (222 lb 11.2 oz)   SpO2 98%   BMI 28.59 kg/m  General: not in acute distress Neck: JVP flat, neck supple Heart: regular rate and rhythm, S1, S2, no murmurs  Lungs: CTAB  GI: non tender, non distended, bowel sounds present Extremities: no edema Neuro: AAO x 3  Psych: normal affect, no anxiety   Labs:  No results found for this or any previous visit (from the past 24 hour(s)).   Radiology/Studies: No results found.  EKG: NSR with left anterior fascicular block  Echo: pending  Nuclear exercise stress test: post stress LVEF  66%, small reversible defect in anteroseptal region, no WMA.  Performed 10.2 METS  Medical decision making:  Discussed care with the patient Discussed care with the physician on the phone Reviewed labs and imaging personally Reviewed prior records  ASSESSMENT AND PLAN:  This is a 46 y.o. male with no prior history of CAD but has significant h/o HTN and GERD presented with persistent CP.    Principal Problem:   Unstable angina (HCC) Active Problems:   Hypertension, benign   Esophageal reflux   Unstable angina  CP at rest, persistent with -ve troponin. Heart score 4, +ve stress in LAD territory (low to intermediate risk). Able to perform 10.2 METs with exercise without limiting angina.   - started on IV heparin b/c ongonig CP, cycle troponin, echo in AM, NPO post midnight for possible cath in the AM. Lipid panel ordered.  - If -ve cardiac cath then consider intensifying GERD therapy with increasing antacid dosage or possibly treat for esophageal spasm with long acting nitrate/CCB  Hypertension, continue lisinopril  GERD - continue antacid.   Signed, Joellyn Rued, MD MS 12/11/2016, 7:12 AM

## 2016-12-11 NOTE — Progress Notes (Signed)
Pt admitted with chest pain from [facility]. Abnormal nuclear stress test with anteroseptal defect. He has negative troponin. He has ongoing chest pain this am. I am not certain that his pain is from obstructive CAD. No evidence of ACS but with ongoing moderately severe chest pain, will plan cardiac cath this am. Labs ok. Creatinine and H/H reviewed. Will place cath orders. Risks and benefits reviewed.   Verne CarrowChristopher Rosabel Sermeno 12/11/2016 7:55 AM

## 2016-12-12 ENCOUNTER — Observation Stay (HOSPITAL_BASED_OUTPATIENT_CLINIC_OR_DEPARTMENT_OTHER): Payer: Self-pay

## 2016-12-12 ENCOUNTER — Ambulatory Visit: Payer: BLUE CROSS/BLUE SHIELD | Admitting: Cardiology

## 2016-12-12 ENCOUNTER — Emergency Department (HOSPITAL_COMMUNITY)
Admission: EM | Admit: 2016-12-12 | Discharge: 2016-12-12 | Disposition: A | Discharge status: Home / Self Care | Payer: Self-pay | Attending: Emergency Medicine | Admitting: Emergency Medicine

## 2016-12-12 ENCOUNTER — Encounter (HOSPITAL_COMMUNITY): Payer: Self-pay | Admitting: *Deleted

## 2016-12-12 ENCOUNTER — Telehealth: Payer: Self-pay | Admitting: Cardiovascular Disease

## 2016-12-12 DIAGNOSIS — R072 Precordial pain: Secondary | ICD-10-CM

## 2016-12-12 DIAGNOSIS — Z9889 Other specified postprocedural states: Secondary | ICD-10-CM | POA: Insufficient documentation

## 2016-12-12 DIAGNOSIS — I361 Nonrheumatic tricuspid (valve) insufficiency: Secondary | ICD-10-CM

## 2016-12-12 DIAGNOSIS — Z5321 Procedure and treatment not carried out due to patient leaving prior to being seen by health care provider: Secondary | ICD-10-CM | POA: Insufficient documentation

## 2016-12-12 LAB — ECHOCARDIOGRAM COMPLETE
HEIGHTINCHES: 74 in
Weight: 3598.4 oz

## 2016-12-12 LAB — CBC
HCT: 45.8 % (ref 39.0–52.0)
Hemoglobin: 16.5 g/dL (ref 13.0–17.0)
MCH: 32 pg (ref 26.0–34.0)
MCHC: 36 g/dL (ref 30.0–36.0)
MCV: 88.9 fL (ref 78.0–100.0)
PLATELETS: 150 10*3/uL (ref 150–400)
RBC: 5.15 MIL/uL (ref 4.22–5.81)
RDW: 13.2 % (ref 11.5–15.5)
WBC: 8.4 10*3/uL (ref 4.0–10.5)

## 2016-12-12 LAB — BASIC METABOLIC PANEL
Anion gap: 6 (ref 5–15)
BUN: 9 mg/dL (ref 6–20)
CALCIUM: 9.6 mg/dL (ref 8.9–10.3)
CO2: 26 mmol/L (ref 22–32)
Chloride: 106 mmol/L (ref 101–111)
Creatinine, Ser: 1.1 mg/dL (ref 0.61–1.24)
GFR calc Af Amer: >60 mL/min (ref >60)
GLUCOSE: 129 mg/dL — AB (ref 65–99)
Potassium: 3.9 mmol/L (ref 3.5–5.1)
Sodium: 138 mmol/L (ref 135–145)

## 2016-12-12 MED ORDER — ATORVASTATIN CALCIUM 20 MG PO TABS: 20.0000 mg | ORAL_TABLET | Freq: Every day | ORAL | 0 refills | Status: DC

## 2016-12-12 MED ORDER — ASPIRIN 81 MG PO TBEC: 81.0000 mg | DELAYED_RELEASE_TABLET | Freq: Every day | ORAL | Status: DC

## 2016-12-12 NOTE — ED Notes (Signed)
Spoke with Dr. Jacqulyn BathLong about patient and he just recommended cbc and bmet.

## 2016-12-12 NOTE — Discharge Summary (Signed)
Discharge Summary    Patient ID: Zachary Delacruz,  MRN: [id number], DOB/AGE: May 04, 1970 46 y.o.  Admit date: 12/11/2016 Discharge date: 12/12/2016  Primary Care Provider: System, Provider Not In Primary Cardiologist: [facility]   Discharge Diagnoses    Principal Problem:   Unstable angina [facility]) Active Problems:   Hypertension, benign   Esophageal reflux   Abnormal nuclear stress test   Allergies Allergies  Allergen Reactions  . Metoclopramide Hcl Other (See Comments)    tachycardia    Diagnostic Studies/Procedures    LHC: 12/11/16  Conclusion    Mid and distal LAD eccentric luminal irregularities up to 30% narrowing.  Widely patent coronaries with no angiographically significant obstruction noted.  Normal left ventricular function and filling pressures. EF 60%.  RECOMMENDATIONS:   No obstructive coronary disease to account for the patient's symptoms.   TTE: 12/12/16  Study Conclusions  - Left ventricle: The cavity size was normal. Systolic function was   vigorous. The estimated ejection fraction was in the range of 65%   to 70%. Wall motion was normal; there were no regional wall   motion abnormalities. There was no evidence of elevated   ventricular filling pressure by Doppler parameters. - Tricuspid valve: There was mild regurgitation. - Pulmonary arteries: Systolic pressure was mildly increased. PA   peak pressure: 33 mm Hg (S). _____________   History of Present Illness     46 yo male with PMH of HTN and GERD who presented to [facility] hospital with chest pain. Underwent CTA that was negative for PE and EKG showed NSR. Underwent a stress test that showed an anteroseptal reversible defect. Given this finding he was sent to [facility] for further work up with cardiac cath.   Hospital Course      Underwent LHC with Dr. Katrinka Blazing noted above with no CAD and normal EF/LVEDP. Post cath labs were stable. Follow up echo showed normal EF with no WMA. No further  episodes of chest pain.   General: Well developed, well nourished, male appearing in no acute distress. Head: Normocephalic, atraumatic.  Neck: Supple without bruits, JVD. Lungs:  Resp regular and unlabored, CTA. Heart: RRR, S1, S2, no S3, S4, or murmur; no rub. Abdomen: Soft, non-tender, non-distended with normoactive bowel sounds. No hepatomegaly. No rebound/guarding. No obvious abdominal masses. Extremities: No clubbing, cyanosis, edema. Distal pedal pulses are 2+ bilaterally. R radial cath site stable without bruising or hematoma Neuro: Alert and oriented X 3. Moves all extremities spontaneously. Psych: Normal affect.  Zachary Delacruz was seen by Dr. Clifton James and determined stable for discharge home. Follow up in the office has been arranged. Medications are listed below.   _____________  Discharge Vitals Blood pressure 130/81, pulse 82, temperature 97.9 F (36.6 C), temperature source Oral, resp. rate 18, height  (1.88 m), weight 224 lb 14.4 oz (102 kg), SpO2 98 %.  Filed Weights   12/11/16 0352 12/11/16 0838 12/12/16 0500  Weight: 222 lb 11.2 oz (101 kg) 222 lb 11.2 oz (101 kg) 224 lb 14.4 oz (102 kg)    Labs & Radiologic Studies    CBC  Recent Labs  12/11/16 0646  WBC 7.0  HGB 15.9  HCT 43.9  MCV 88.7  PLT 126*   Basic Metabolic Panel  Recent Labs  12/11/16 0646  NA 138  K 3.7  CL 107  CO2 23  GLUCOSE 98  BUN 11  CREATININE 1.10  CALCIUM 8.9   Liver Function Tests  Recent Labs  12/11/16 0646  AST 28  ALT 30  ALKPHOS 59  BILITOT 1.5*  PROT 6.3*  ALBUMIN 3.8   No results for input(s): LIPASE, AMYLASE in the last 72 hours. Cardiac Enzymes  Recent Labs  12/11/16 0646 12/11/16 1226 12/11/16 1838  TROPONINI <0.03 <0.03 <0.03   BNP Invalid input(s): POCBNP D-Dimer No results for input(s): DDIMER in the last 72 hours. Hemoglobin A1C  Recent Labs  12/11/16 0639  HGBA1C 4.5*   Fasting Lipid Panel  Recent Labs  12/11/16 0639    CHOL 144  HDL 27*  LDLCALC 81  TRIG 161181*  CHOLHDL 5.3   Thyroid Function Tests  Recent Labs  12/11/16 0639  TSH 2.926   _____________  No results found. Disposition   Pt is being discharged home today in good condition.  Follow-up Plans & Appointments    Follow-up Information    Kathleene HazelMcAlhany, Bethel Gaglio D, MD Follow up.   Specialty:  Cardiology Why:  The office will call for your follow up appt.  Contact information: [address] [place] [address] [phone]          Discharge Instructions    Call MD for:  redness, tenderness, or signs of infection (pain, swelling, redness, odor or green/yellow discharge around incision site)    Complete by:  As directed    Diet - low sodium heart healthy    Complete by:  As directed    Discharge instructions    Complete by:  As directed    Radial Site Care Refer to this sheet in the next few weeks. These instructions provide you with information on caring for yourself after your procedure. Your caregiver may also give you more specific instructions. Your treatment has been planned according to current medical practices, but problems sometimes occur. Call your caregiver if you have any problems or questions after your procedure. HOME CARE INSTRUCTIONS You may shower the day after the procedure.Remove the bandage (dressing) and gently wash the site with plain soap and water.Gently pat the site dry.  Do not apply powder or lotion to the site.  Do not submerge the affected site in water for 3 to 5 days.  Inspect the site at least twice daily.  Do not flex or bend the affected arm for 24 hours.  No lifting over 5 pounds (2.3 kg) for 5 days after your procedure.  Do not drive home if you are discharged the same day of the procedure. Have someone else drive you.  You may drive 24 hours after the procedure unless otherwise instructed by your caregiver.  What to expect: Any bruising will usually fade within 1 to 2  weeks.  Blood that collects in the tissue (hematoma) may be painful to the touch. It should usually decrease in size and tenderness within 1 to 2 weeks.  SEEK IMMEDIATE MEDICAL CARE IF: You have unusual pain at the radial site.  You have redness, warmth, swelling, or pain at the radial site.  You have drainage (other than a small amount of blood on the dressing).  You have chills.  You have a fever or persistent symptoms for more than 72 hours.  You have a fever and your symptoms suddenly get worse.  Your arm becomes pale, cool, tingly, or numb.  You have heavy bleeding from the site. Hold pressure on the site.   Increase activity slowly    Complete by:  As directed       Discharge Medications  Medication List    TAKE these medications   aspirin 81 MG EC tablet Take 1 tablet (81 mg total) by mouth daily.   atorvastatin 20 MG tablet Commonly known as:  LIPITOR Take 1 tablet (20 mg total) by mouth daily at 6 PM.   fluticasone 50 MCG/ACT nasal spray Commonly known as:  FLONASE Place 2 sprays into both nostrils daily.   lisinopril 10 MG tablet Commonly known as:  PRINIVIL,ZESTRIL Take 10 mg by mouth daily.   omeprazole 40 MG capsule Commonly known as:  PRILOSEC Take 40 mg by mouth daily.         Outstanding Labs/Studies   FLP/LFTs in 6 weeks.   Duration of Discharge Encounter   Greater than 30 minutes including physician time.  Signed, Laverda Page NP-C 12/12/2016, 1:20 PM   See my full note this am. cdm

## 2016-12-12 NOTE — Telephone Encounter (Signed)
Spoke with pt and is complaining of right thumb and hand tingling radiating up the arm no other symptoms no raised area,drainiage discoloration ,or temp change .Discussed with Dr Eldridge DaceVaranasi have pt apply warm compress and continue to monitor.Iif s/s worsen then needs to go to er for eval and tx.Pt agrees ./cy

## 2016-12-12 NOTE — Telephone Encounter (Signed)
°  New Message   pt verbalized that he is calling for rn   He had a cath done and he is feeling   From his thumb to his forearm and its numb and tingly   It started in the last 20 minutes

## 2016-12-12 NOTE — ED Notes (Signed)
Pt requesting to leave; informed pt to follow up with cardiologist at soon as possible due to numbness

## 2016-12-12 NOTE — Care Management Note (Signed)
Case Management Note  Patient Details  Name: Rico JunkerJohn M Wittner MRN: [id number] Date of Birth: 11/09/1970  Subjective/Objective:  Pt presented for Unstable Angina. Pt is in between jobs and he has no insurance at this time. Pt has PCP [facility]egina York [facility]. Pt states he is able to afford current medications. Pt uses [facility]- CM did make him aware of the [facility] $4.00 Medication List.   Action/Plan: No further needs from CM at this time.   Expected Discharge Date:  12/12/16               Expected Discharge Plan:  Home/Self Care  In-House Referral:  NA  Discharge planning Services  CM Consult  Post Acute Care Choice:  NA Choice offered to:  NA  DME Arranged:  N/A DME Agency:  NA  HH Arranged:  NA HH Agency:  NA  Status of Service:  Completed, signed off  If discussed at Long Length of Stay Meetings, dates discussed:    Additional Comments:  Gala LewandowskyGraves-Bigelow, Ebone Alcivar Kaye, RN 12/12/2016, 1:05 PM

## 2016-12-12 NOTE — Progress Notes (Signed)
  Echocardiogram 2D Echocardiogram has been performed.  Leta JunglingCooper, Cherrise Occhipinti M 12/12/2016, 11:04 AM

## 2016-12-12 NOTE — Progress Notes (Signed)
Progress Note  Patient Name: Zachary JunkerJohn M Delacruz Date of Encounter: 12/12/2016  Primary Cardiologist: Clifton JamesMcAlhany  Subjective   Chest pain resolved. No dyspnea  Inpatient Medications    Scheduled Meds: . aspirin EC  81 mg Oral Daily  . atorvastatin  20 mg Oral q1800  . fluticasone  2 spray Each Nare Daily  . heparin  5,000 Units Subcutaneous Q8H  . lisinopril  20 mg Oral Daily  . pantoprazole  40 mg Oral Daily  . sodium chloride flush  3 mL Intravenous Q12H   Continuous Infusions: . sodium chloride     PRN Meds: sodium chloride, acetaminophen, ondansetron (ZOFRAN) IV, oxyCODONE, sodium chloride flush, traZODone   Vital Signs    Vitals:   12/11/16 2004 12/11/16 2358 12/12/16 0500 12/12/16 0738  BP: 112/81 108/67 115/76 122/88  Pulse: 75 73 74 68  Resp:  19 18 18   Temp:  97.9 F (36.6 C) 98 F (36.7 C) 98.1 F (36.7 C)  TempSrc:    Oral  SpO2:  95% 96% 97%  Weight:   224 lb 14.4 oz (102 kg)   Height:        Intake/Output Summary (Last 24 hours) at 12/12/16 1058 Last data filed at 12/12/16 0900  Gross per 24 hour  Intake          1192.17 ml  Output              250 ml  Net           942.17 ml   Filed Weights   12/11/16 0352 12/11/16 0838 12/12/16 0500  Weight: 222 lb 11.2 oz (101 kg) 222 lb 11.2 oz (101 kg) 224 lb 14.4 oz (102 kg)    Telemetry    sinus - Personally Reviewed  ECG    - Personally Reviewed  Physical Exam   GEN: No acute distress.   Neck: No JVD Cardiac: RRR, no murmurs, rubs, or gallops.  Respiratory: Clear to auscultation bilaterally. GI: Soft, nontender, non-distended  MS: No edema; No deformity. Neuro:  Nonfocal  Psych: Normal affect   Labs    Chemistry Recent Labs Lab 12/11/16 0646  NA 138  K 3.7  CL 107  CO2 23  GLUCOSE 98  BUN 11  CREATININE 1.10  CALCIUM 8.9  PROT 6.3*  ALBUMIN 3.8  AST 28  ALT 30  ALKPHOS 59  BILITOT 1.5*  GFRNONAA >60  GFRAA >60  ANIONGAP 8     Hematology Recent Labs Lab  12/11/16 0646  WBC 7.0  RBC 4.95  HGB 15.9  HCT 43.9  MCV 88.7  MCH 32.1  MCHC 36.2*  RDW 13.3  PLT 126*    Cardiac Enzymes Recent Labs Lab 12/11/16 0646 12/11/16 1226 12/11/16 1838  TROPONINI <0.03 <0.03 <0.03   No results for input(s): TROPIPOC in the last 168 hours.   BNP Recent Labs Lab 12/11/16 0639  BNP 20.8     DDimer No results for input(s): DDIMER in the last 168 hours.   Radiology    No results found.  Cardiac Studies   Cardiac cath 12/11/16:  Mid and distal LAD eccentric luminal irregularities up to 30% narrowing.  Widely patent coronaries with no angiographically significant obstruction noted.  Normal left ventricular function and filling pressures. EF 60%.  Patient Profile     46 y.o. male with h/o HTN and GERD admitted with chest pain and found to have mild CAD by cath on 12/11/16  Assessment & Plan  1. CAD/chest pain: Negative troponin. Mild disease by cath 12/11/16. Will continue statin and ASA. Echo pending this am. If echo is ok, can d/c home today. He can f/u with me in 2-3 weeks.     For questions or updates, please contact [facility] Please consult [email or web address] for contact info under Cardiology/STEMI.      Signed, Verne Carrow, MD  12/12/2016, 10:58 AM

## 2016-12-12 NOTE — ED Triage Notes (Addendum)
Pt had heart cath yesterday and they used his right radial artery for access. Then today he was at store and started having numbness in right thumb that spread up his arm and now over his hand.  No change in grip strength, no pain. Pt has strong right radial pulse

## 2016-12-13 MED FILL — Lidocaine HCl Local Inj 2%: INTRAMUSCULAR | Qty: 10 | Status: AC

## 2017-01-11 ENCOUNTER — Encounter: Payer: Self-pay | Admitting: *Deleted

## 2017-01-28 ENCOUNTER — Ambulatory Visit: Payer: Self-pay | Admitting: Cardiology

## 2017-04-30 IMAGING — DX DG CHEST 2V
3 series · 3 of 3 positions shown · non-contrast
Comparison: CT chest dated 10/11/2013

CLINICAL DATA: Cough, congestion, fever, body aches x 10 days

EXAM:
CHEST  2 VIEW

[chest pa (1 of 2)]
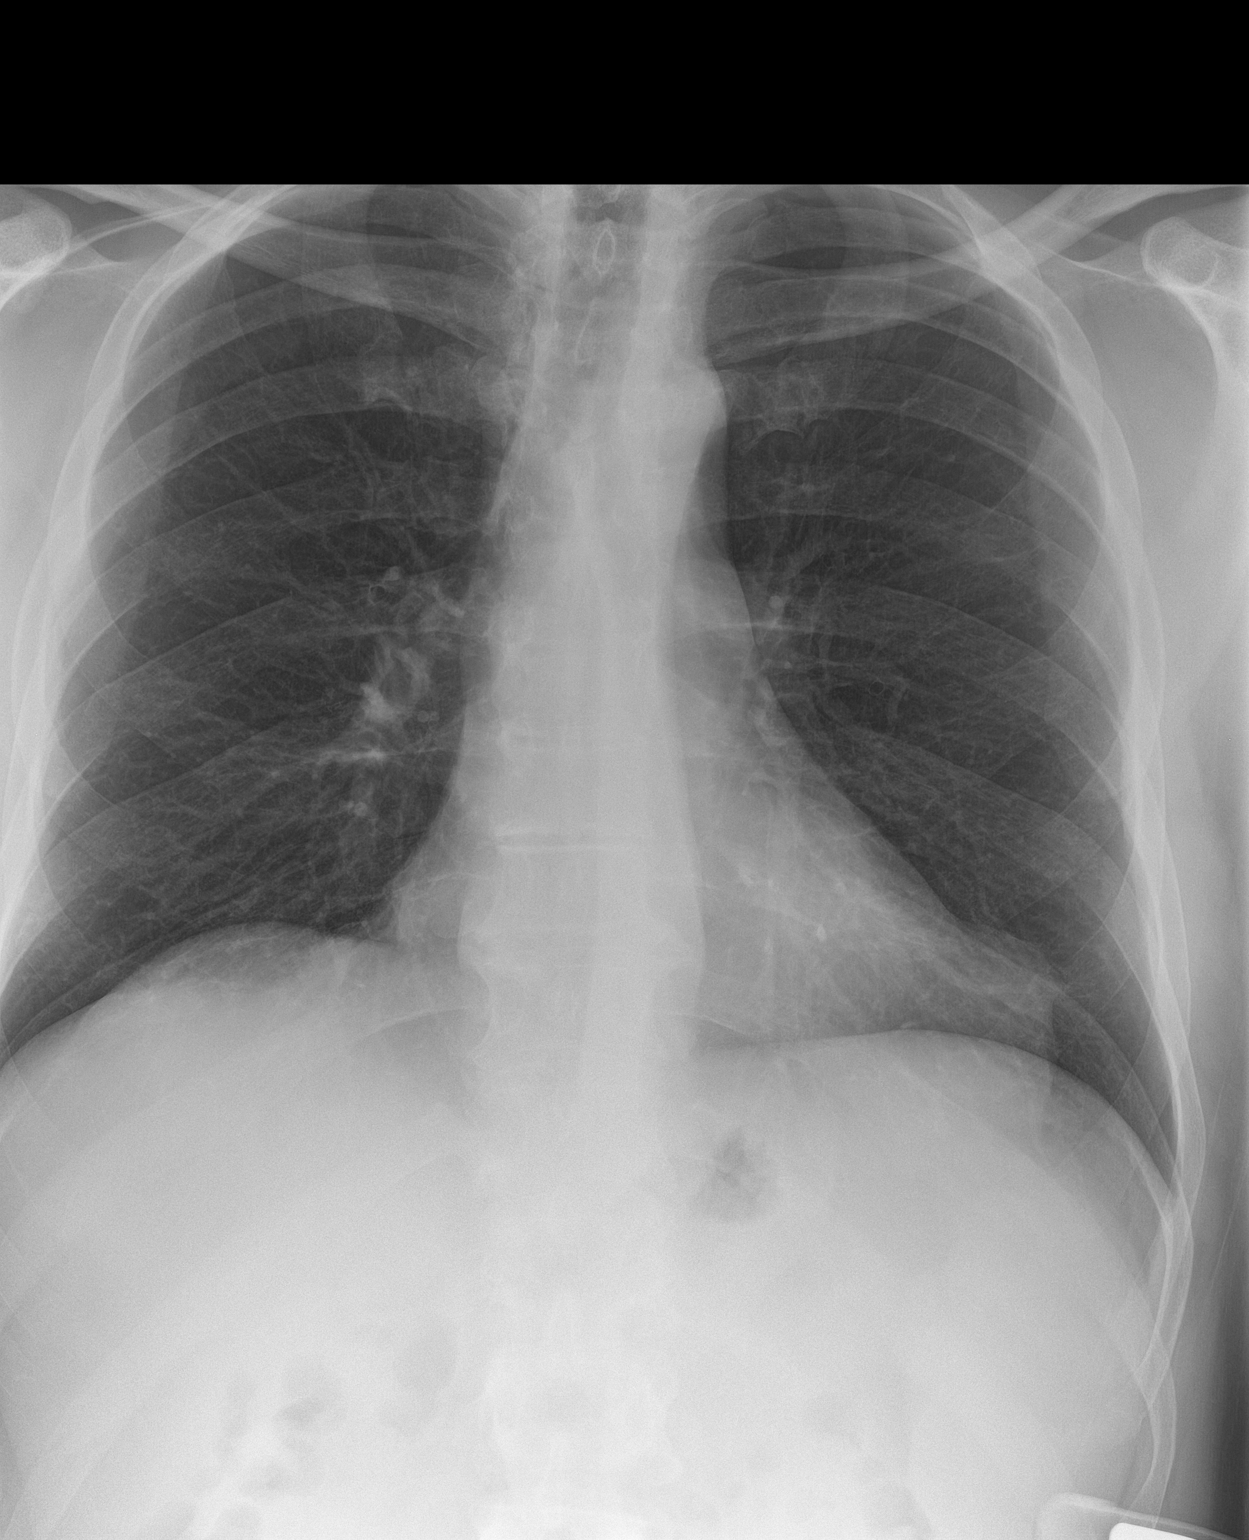

[chest pa (2 of 2)]
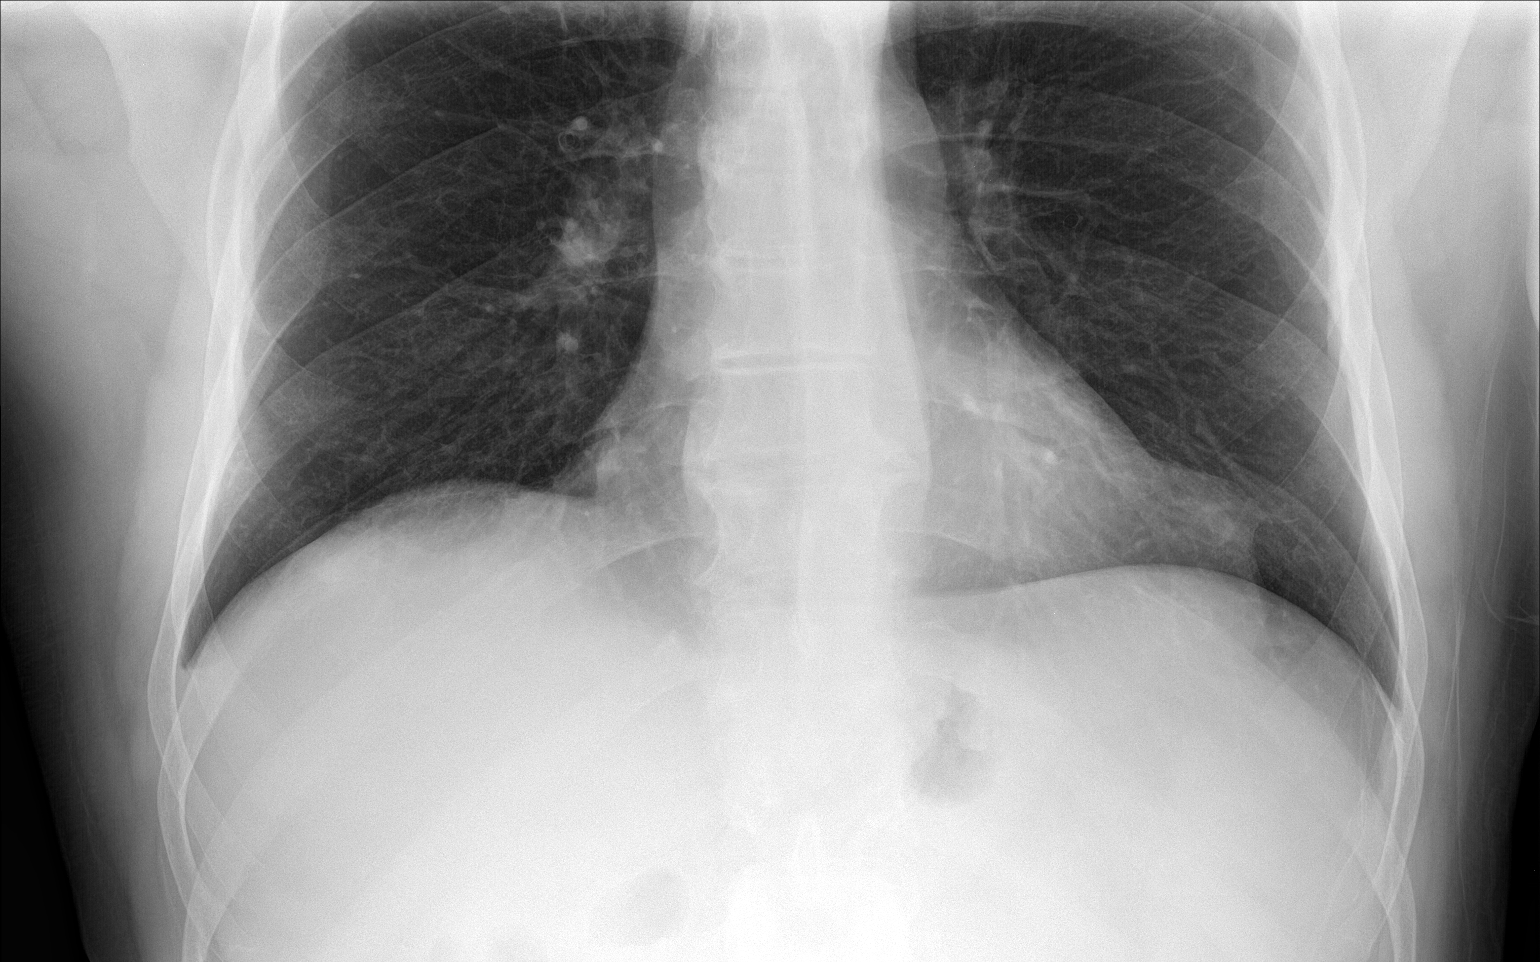

[chest lat]
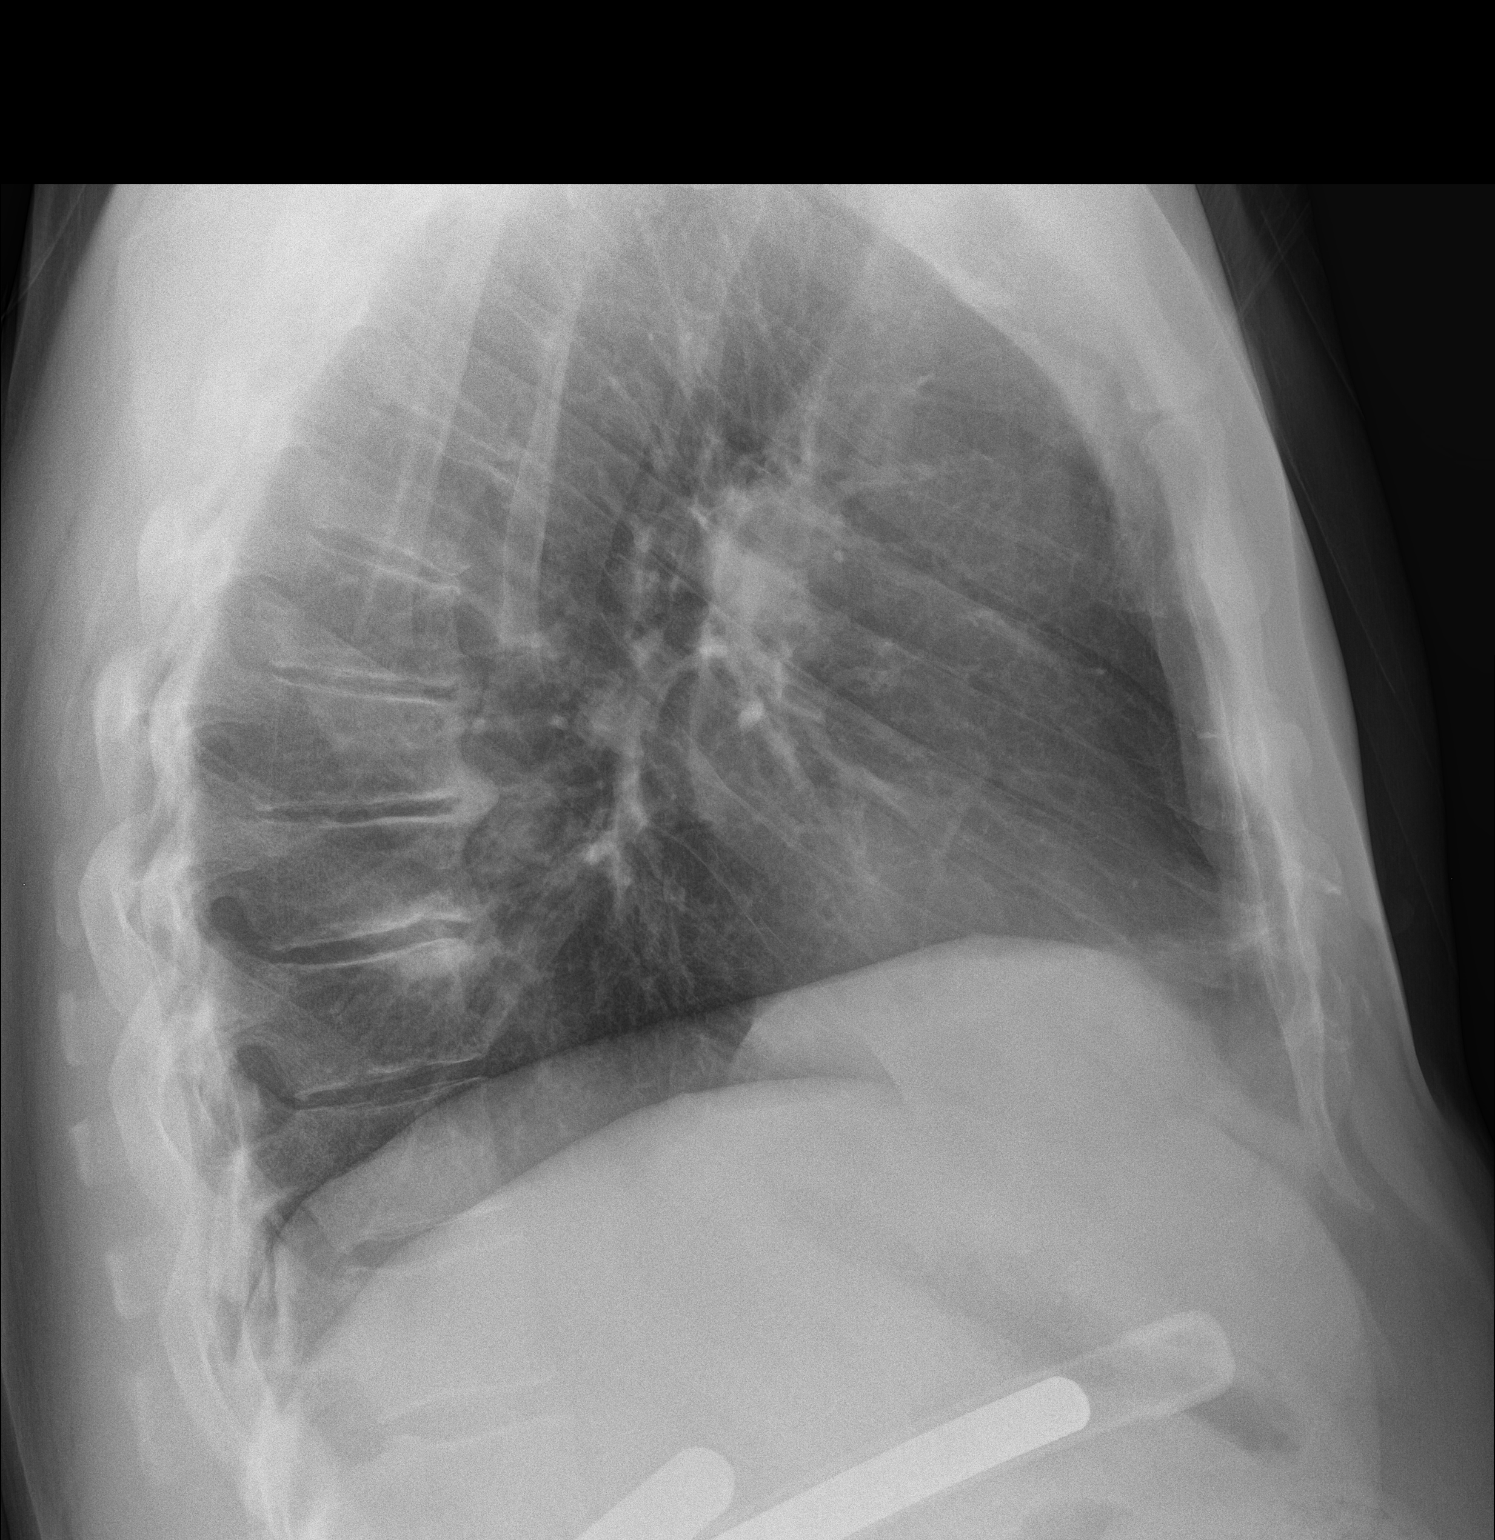

[3 of 3 positions shown; findings below may reference images not displayed]

FINDINGS: Lungs are clear.  No pleural effusion or pneumothorax.

The heart is normal in size.

Mild degenerative changes of the visualized thoracolumbar spine.
IMPRESSION: No evidence of acute cardiopulmonary disease.

## 2017-08-01 DIAGNOSIS — K760 Fatty (change of) liver, not elsewhere classified: Secondary | ICD-10-CM | POA: Diagnosis not present

## 2017-08-01 DIAGNOSIS — Z862 Personal history of diseases of the blood and blood-forming organs and certain disorders involving the immune mechanism: Secondary | ICD-10-CM | POA: Diagnosis not present

## 2017-08-01 DIAGNOSIS — R161 Splenomegaly, not elsewhere classified: Secondary | ICD-10-CM | POA: Diagnosis not present

## 2018-11-19 DIAGNOSIS — E785 Hyperlipidemia, unspecified: Secondary | ICD-10-CM

## 2018-11-19 DIAGNOSIS — R51 Headache: Secondary | ICD-10-CM

## 2018-11-19 DIAGNOSIS — I10 Essential (primary) hypertension: Secondary | ICD-10-CM

## 2018-11-19 DIAGNOSIS — R200 Anesthesia of skin: Secondary | ICD-10-CM

## 2018-11-20 DIAGNOSIS — I10 Essential (primary) hypertension: Secondary | ICD-10-CM

## 2019-01-05 ENCOUNTER — Encounter: Payer: Self-pay | Admitting: *Deleted

## 2019-01-06 ENCOUNTER — Encounter: Payer: Self-pay | Admitting: Neurology

## 2019-01-06 ENCOUNTER — Ambulatory Visit (INDEPENDENT_AMBULATORY_CARE_PROVIDER_SITE_OTHER): Payer: 59 | Admitting: Neurology

## 2019-01-06 ENCOUNTER — Other Ambulatory Visit: Payer: Self-pay

## 2019-01-06 VITALS — BP 128/82 | HR 80 | Temp 97.5°F | Ht 74.0 in | Wt 226.4 lb

## 2019-01-06 DIAGNOSIS — R519 Headache, unspecified: Secondary | ICD-10-CM | POA: Insufficient documentation

## 2019-01-06 MED ORDER — NORTRIPTYLINE HCL 10 MG PO CAPS: 20.0000 mg | ORAL_CAPSULE | Freq: Every day | ORAL | 11 refills | Status: DC

## 2019-01-06 NOTE — Progress Notes (Signed)
PATIENT: Zachary JunkerJohn M Barno DOB: 06/11/1970  Chief Complaint  Patient presents with  . Headache    Reports headaches nearly every day.  He will somtime use OTC NSAIDS with rare improvement.  He has never been on preventive medications.  He had a work-up for stroke at the end of August because of a headache with left arm numbness.   . PCP    Dema SeverinYork, Regina F, NP     HISTORICAL  Zachary Delacruz is a 48 year old male, seen in request by his primary care nurse practitioner Mauricio Poegina York for evaluation of headache, initial evaluation was January 06, 2019.  I have reviewed and summarized the referring note from the referring physician.  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  MRI of the brain showed no significant abnormality.  Laboratory evaluation showed CBC with elevated WBC of 13.9, hemoglobin of 17.4, normal CMP, creatinine of 1.1, LDL 126, cholesterol 182.  REVIEW OF SYSTEMS: Full 14 system review of systems performed and notable only for as above All other review of systems were negative.  ALLERGIES: Allergies  Allergen Reactions  . Lisinopril     Other reaction(s): Other (see comments) Cough  . Metoclopramide Hcl Other (See Comments)    tachycardia    HOME MEDICATIONS: Current Outpatient Medications  Medication Sig Dispense Refill  . amLODipine (NORVASC) 5 MG tablet Take 10 mg by mouth daily.     Marland Kitchen. aspirin EC 81 MG EC tablet Take 1 tablet (81 mg total) by mouth daily.    . fluticasone (FLONASE) 50  MCG/ACT nasal spray Place 2 sprays into both nostrils daily. 16 g 0  . hydrochlorothiazide (MICROZIDE) 12.5 MG capsule Take 12.5 mg by mouth daily.    Marland Kitchen. losartan (COZAAR) 25 MG tablet Take 25 mg by mouth daily.    Marland Kitchen. omeprazole (PRILOSEC) 20 MG capsule Take 20 mg by mouth daily.     No current facility-administered medications for this visit.     PAST MEDICAL HISTORY: Past Medical History:  Diagnosis Date  . GERD (gastroesophageal reflux disease)   . Headache   . Hyperlipemia   . Hypertension   . OSA (obstructive sleep apnea)     PAST SURGICAL HISTORY: Past Surgical History:  Procedure Laterality Date  . APPENDECTOMY    . cataract surgery    . LEFT HEART CATH AND CORONARY ANGIOGRAPHY N/A 12/11/2016   Procedure: LEFT HEART CATH AND CORONARY ANGIOGRAPHY;  Surgeon: Lyn RecordsSmith, Henry W, MD;  Location: [facility];  Service: Cardiovascular;  Laterality: N/A;  . LUMBAR FUSION    . NASAL SINUS SURGERY    . right knee surgery    . throat biopsy      FAMILY HISTORY: Family History  Problem Relation Age of Onset  . Healthy Mother   . Cirrhosis Father        died at age 50  . COPD Father   . Premature CHD Neg Hx     SOCIAL HISTORY: Social History   Socioeconomic History  .  Marital status: Single    Spouse name: Not on file  . Number of children: 4  . Years of education: 61  . Highest education level: High school graduate  Occupational History  . Occupation: Probation officer  . Financial resource strain: Not on file  . Food insecurity    Worry: Not on file    Inability: Not on file  . Transportation needs    Medical: Not on file    Non-medical: Not on file  Tobacco Use  . Smoking status: Never Smoker  . Smokeless tobacco: Never Used  Substance and Sexual Activity  . Alcohol use: Yes    Comment: rarely  . Drug use: No  . Sexual activity: Not on file  Lifestyle  . Physical activity    Days per week: Not on file    Minutes per session: Not on file  .  Stress: Not on file  Relationships  . Social Musician on phone: Not on file    Gets together: Not on file    Attends religious service: Not on file    Active member of club or organization: Not on file    Attends meetings of clubs or organizations: Not on file    Relationship status: Not on file  . Intimate partner violence    Fear of current or ex partner: Not on file    Emotionally abused: Not on file    Physically abused: Not on file    Forced sexual activity: Not on file  Other Topics Concern  . Not on file  Social History Narrative   Lives at home with his significant other, Ulyses Amor.   Right-handed.   One cup caffeine per day.     PHYSICAL EXAM   Vitals:   01/06/19 0822  BP: 128/82  Pulse: 80  Temp: (!) 97.5 F (36.4 C)  Weight: 226 lb 6.4 oz (102.7 kg)  Height: 6\' 2"  (1.88 m)    Not recorded      Body mass index is 29.07 kg/m.  PHYSICAL EXAMNIATION:  Gen: NAD, conversant, well nourised, well groomed                     Cardiovascular: Regular rate rhythm, no peripheral edema, warm, nontender. Eyes: Conjunctivae clear without exudates or hemorrhage Neck: Supple, no carotid bruits. Pulmonary: Clear to auscultation bilaterally   NEUROLOGICAL EXAM:  MENTAL STATUS: Speech:    Speech is normal; fluent and spontaneous with normal comprehension.  Cognition:     Orientation to time, place and person     Normal recent and remote memory     Normal Attention span and concentration     Normal Language, naming, repeating,spontaneous speech     Fund of knowledge   CRANIAL NERVES: CN II: Visual fields are full to confrontation. pupils are round equal and briskly reactive to light. CN III, IV, VI: extraocular movement are normal. No ptosis. CN V: Facial sensation is intact to pinprick in all 3 divisions bilaterally. Corneal responses are intact.  CN VII: Face is symmetric with normal eye closure and smile. CN VIII: Hearing is normal to causal  conversation. CN IX, X: Palate elevates symmetrically. Phonation is normal. CN XI: Head turning and shoulder shrug are intact CN XII: Tongue is midline with normal movements and no atrophy.  MOTOR: There is no pronator drift of out-stretched arms. Muscle bulk and tone are normal. Muscle strength is normal.  REFLEXES: Reflexes are 2+  and symmetric at the biceps, triceps, knees, and ankles. Plantar responses are flexor.  SENSORY: Intact to light touch, pinprick, positional sensation and vibratory sensation are intact in fingers and toes.  COORDINATION: Rapid alternating movements and fine finger movements are intact. There is no dysmetria on finger-to-nose and heel-knee-shin.    GAIT/STANCE: Posture is normal. Gait is steady with normal steps, base, arm swing, and turning. Heel and toe walking are normal. Tandem gait is normal.  Romberg is absent.   DIAGNOSTIC DATA (LABS, IMAGING, TESTING) - I reviewed patient records, labs, notes, testing and imaging myself where available.   ASSESSMENT AND PLAN  TRIGO WINTERBOTTOM is a 48 y.o. 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 Ph.D.  [facility] [address], [place], [place] [address] Ph: [phone] Fax: [phone]  CC: Referring Provider

## 2019-03-20 DIAGNOSIS — I361 Nonrheumatic tricuspid (valve) insufficiency: Secondary | ICD-10-CM

## 2019-03-31 ENCOUNTER — Telehealth: Payer: Self-pay

## 2019-03-31 NOTE — Telephone Encounter (Signed)
Called pt to r/s their appt. NP Butler Denmark has a Dr.Appt. Pts states they will call back and R/s.

## 2019-04-08 ENCOUNTER — Ambulatory Visit: Payer: Self-pay | Admitting: Neurology

## 2019-06-17 ENCOUNTER — Telehealth: Payer: Self-pay | Admitting: Physician Assistant

## 2019-06-17 NOTE — Telephone Encounter (Signed)
Called to discuss with Zachary Delacruz about Covid symptoms and the use of bamlanivimab, a monoclonal antibody infusion for those with mild to moderate Covid symptoms and at a high risk of hospitalization.     Pt is not qualified for this infusion due to lack of identified risk factors and co-morbid conditions.  Symptoms reviewed as well as criteria for ending isolation.  Symptoms reviewed that would warrant ED/Hospital evaluation as well should her condition worsen. Preventative practices reviewed. Patient verbalized understanding.  Patient Active Problem List   Diagnosis Date Noted  . Nonintractable headache 01/06/2019  . Unstable angina (HCC) 12/11/2016  . Abnormal nuclear stress test   . Esophageal reflux 08/11/2012  . Hypertension, benign 10/18/2011     Manson Passey, PA

## 2021-01-02 DIAGNOSIS — R1011 Right upper quadrant pain: Secondary | ICD-10-CM | POA: Diagnosis not present

## 2021-01-10 ENCOUNTER — Encounter: Payer: Self-pay | Admitting: Gastroenterology

## 2021-02-03 DIAGNOSIS — K828 Other specified diseases of gallbladder: Secondary | ICD-10-CM | POA: Diagnosis not present

## 2021-02-06 DIAGNOSIS — K828 Other specified diseases of gallbladder: Secondary | ICD-10-CM | POA: Diagnosis not present

## 2021-02-06 DIAGNOSIS — K811 Chronic cholecystitis: Secondary | ICD-10-CM | POA: Diagnosis not present

## 2021-02-06 DIAGNOSIS — K8020 Calculus of gallbladder without cholecystitis without obstruction: Secondary | ICD-10-CM | POA: Diagnosis not present

## 2021-02-06 DIAGNOSIS — G4730 Sleep apnea, unspecified: Secondary | ICD-10-CM | POA: Diagnosis not present

## 2021-02-06 DIAGNOSIS — I10 Essential (primary) hypertension: Secondary | ICD-10-CM | POA: Diagnosis not present

## 2021-02-06 DIAGNOSIS — K219 Gastro-esophageal reflux disease without esophagitis: Secondary | ICD-10-CM | POA: Diagnosis not present

## 2021-03-14 DIAGNOSIS — N433 Hydrocele, unspecified: Secondary | ICD-10-CM | POA: Diagnosis not present

## 2021-03-14 DIAGNOSIS — I861 Scrotal varices: Secondary | ICD-10-CM | POA: Diagnosis not present

## 2021-03-14 DIAGNOSIS — N50811 Right testicular pain: Secondary | ICD-10-CM | POA: Diagnosis not present

## 2021-04-21 DIAGNOSIS — N401 Enlarged prostate with lower urinary tract symptoms: Secondary | ICD-10-CM | POA: Diagnosis not present

## 2021-04-21 DIAGNOSIS — N50819 Testicular pain, unspecified: Secondary | ICD-10-CM | POA: Diagnosis not present

## 2021-05-18 DIAGNOSIS — J069 Acute upper respiratory infection, unspecified: Secondary | ICD-10-CM | POA: Diagnosis not present

## 2021-05-25 DIAGNOSIS — R059 Cough, unspecified: Secondary | ICD-10-CM | POA: Diagnosis not present

## 2021-05-25 DIAGNOSIS — Z6830 Body mass index (BMI) 30.0-30.9, adult: Secondary | ICD-10-CM | POA: Diagnosis not present

## 2021-05-25 DIAGNOSIS — J22 Unspecified acute lower respiratory infection: Secondary | ICD-10-CM | POA: Diagnosis not present

## 2021-05-25 DIAGNOSIS — Z79899 Other long term (current) drug therapy: Secondary | ICD-10-CM | POA: Diagnosis not present

## 2021-06-13 ENCOUNTER — Encounter: Payer: Self-pay | Admitting: Pulmonary Disease

## 2021-06-13 ENCOUNTER — Other Ambulatory Visit: Payer: Self-pay

## 2021-06-13 ENCOUNTER — Ambulatory Visit: Payer: BC Managed Care – PPO | Admitting: Pulmonary Disease

## 2021-06-13 VITALS — BP 124/86 | HR 74 | Ht 74.0 in | Wt 233.8 lb

## 2021-06-13 DIAGNOSIS — J302 Other seasonal allergic rhinitis: Secondary | ICD-10-CM

## 2021-06-13 DIAGNOSIS — J4520 Mild intermittent asthma, uncomplicated: Secondary | ICD-10-CM

## 2021-06-13 MED ORDER — LEVALBUTEROL TARTRATE 45 MCG/ACT IN AERO: 1.0000 | INHALATION_SPRAY | Freq: Four times a day (QID) | RESPIRATORY_TRACT | 12 refills | Status: AC | PRN

## 2021-06-13 MED ORDER — FLUTICASONE PROPIONATE 50 MCG/ACT NA SUSP: 1.0000 | Freq: Every day | NASAL | 2 refills | Status: AC

## 2021-06-13 NOTE — Progress Notes (Signed)
? ?Synopsis: Referred in March 2023 for asthma, self referral ? ?Subjective:  ? ?PATIENT ID: Zachary Delacruz GENDER: male DOB: [birth year], MRN: [id number] ? ?HPI ? ?Chief Complaint  ?Patient presents with  ? Consult  ?  Self referral for history of asthma and frequent URI. Last URI was about 3 weeks ago. Has noticed an increase in SOB over the past few months.   ? ?Zachary Delacruz is a 51 year old male, never smoker with GERD, OSA and hypertension who is referred to pulmonary clinic for asthma.  ? ?He reports being diagnosed with asthma a few years ago.  He has noticed shortness of breath when bending over to tie his shoes and also with exertional activities such as this cough.  He reports having to stop playing disc golf a couple times due to the shortness of breath.  He reports having to upper respiratory infections at least twice per year with significant sinus congestion and postnasal drainage.  Outside of these episodes of respiratory infections he has low baseline sinus congestion and drainage and intermittent shortness of breath.  Reports cough and wheezing during acute infections.  Denies any nighttime awakenings due to cough, shortness of breath or wheezing. ? ?He was most recently treated for upper respiratory infection with prednisone and azithromycin about 3 weeks ago. ? ?He has been evaluated by Dr. Blenda Nicely of [facility] pulmonology previously and was trialed on Trelegy Ellipta 1 puff daily and albuterol inhaler as needed.  He has not taken Trelegy in the past year and he reports albuterol inhaler makes him feel anxious/jittery. ? ?He has history of GERD which is well controlled on daily omeprazole. ? ?He is a never smoker but reports significant secondhand smoke from his parents in childhood.  His sister has asthma since childhood and is currently followed at [facility] pulmonary.  His father had COPD/emphysema.  He works as an Probation officer. ? ?Past Medical History:  ?Diagnosis Date  ? GERD (gastroesophageal reflux  disease)   ? Headache   ? Hyperlipemia   ? Hypertension   ? OSA (obstructive sleep apnea)   ?  ? ?Family History  ?Problem Relation Age of Onset  ? Healthy Mother   ? Cirrhosis Father   ?     died at age 51  ? COPD Father   ? Asthma Sister   ? Premature CHD Neg Hx   ?  ? ?Social History  ? ?Socioeconomic History  ? Marital status: Single  ?  Spouse name: Not on file  ? Number of children: 4  ? Years of education: 50  ? Highest education level: High school graduate  ?Occupational History  ? Occupation: Building surveyor  ?Tobacco Use  ? Smoking status: Never  ?  Passive exposure: Past  ? Smokeless tobacco: Never  ?Substance and Sexual Activity  ? Alcohol use: Yes  ?  Comment: rarely  ? Drug use: No  ? Sexual activity: Not on file  ?Other Topics Concern  ? Not on file  ?Social History Narrative  ? Lives at home with his significant other, Ulyses Amor.  ? Right-handed.  ? One cup caffeine per day.  ? ?Social Determinants of Health  ? ?Financial Resource Strain: Not on file  ?Food Insecurity: Not on file  ?Transportation Needs: Not on file  ?Physical Activity: Not on file  ?Stress: Not on file  ?Social Connections: Not on file  ?Intimate Partner Violence: Not on file  ?  ? ?Allergies  ?Allergen Reactions  ? Lisinopril   ?  Other reaction(s): Other (see comments) ?Cough  ? Metoclopramide Hcl Other (See Comments)  ?  tachycardia  ?  ? ?Outpatient Medications Prior to Visit  ?Medication Sig Dispense Refill  ? olmesartan (BENICAR) 20 MG tablet Take 20 mg by mouth daily.    ? omeprazole (PRILOSEC) 20 MG capsule Take 20 mg by mouth daily.    ? amLODipine (NORVASC) 5 MG tablet Take 10 mg by mouth daily.     ? aspirin EC 81 MG EC tablet Take 1 tablet (81 mg total) by mouth daily.    ? fluticasone (FLONASE) 50 MCG/ACT nasal spray Place 2 sprays into both nostrils daily. 16 g 0  ? hydrochlorothiazide (MICROZIDE) 12.5 MG capsule Take 12.5 mg by mouth daily.    ? losartan (COZAAR) 25 MG tablet Take 25 mg by mouth daily.    ?  nortriptyline (PAMELOR) 10 MG capsule Take 2 capsules (20 mg total) by mouth at bedtime. 60 capsule 11  ? ?No facility-administered medications prior to visit.  ? ?Review of Systems  ?Constitutional:  Negative for chills, fever, malaise/fatigue and weight loss.  ?HENT:  Positive for congestion. Negative for sinus pain and sore throat.   ?Eyes: Negative.   ?Respiratory:  Positive for cough, sputum production and shortness of breath. Negative for hemoptysis and wheezing.   ?Cardiovascular:  Negative for chest pain, palpitations, orthopnea, claudication and leg swelling.  ?Gastrointestinal:  Negative for abdominal pain, heartburn, nausea and vomiting.  ?Genitourinary: Negative.   ?Musculoskeletal:  Negative for joint pain and myalgias.  ?Skin:  Negative for rash.  ?Neurological:  Positive for headaches. Negative for weakness.  ?Endo/Heme/Allergies: Negative.   ?Psychiatric/Behavioral: Negative.    ? ?Objective:  ? ?Vitals:  ? 06/13/21 1544  ?BP: 124/86  ?Pulse: 74  ?SpO2: 96%  ?Weight: 233 lb 12.8 oz (106.1 kg)  ?Height: 6\' 2"  (1.88 m)  ? ? ? ?Physical Exam ?Constitutional:   ?   General: He is not in acute distress. ?HENT:  ?   Head: Normocephalic and atraumatic.  ?Eyes:  ?   Extraocular Movements: Extraocular movements intact.  ?   Conjunctiva/sclera: Conjunctivae normal.  ?   Pupils: Pupils are equal, round, and reactive to light.  ?Cardiovascular:  ?   Rate and Rhythm: Normal rate and regular rhythm.  ?   Pulses: Normal pulses.  ?   Heart sounds: Normal heart sounds. No murmur heard. ?Pulmonary:  ?   Effort: Pulmonary effort is normal.  ?   Breath sounds: Normal breath sounds.  ?Abdominal:  ?   General: Bowel sounds are normal.  ?   Palpations: Abdomen is soft.  ?Musculoskeletal:  ?   Right lower leg: No edema.  ?   Left lower leg: No edema.  ?Lymphadenopathy:  ?   Cervical: No cervical adenopathy.  ?Skin: ?   General: Skin is warm and dry.  ?Neurological:  ?   General: No focal deficit present.  ?   Mental Status:  He is alert.  ?Psychiatric:     ?   Mood and Affect: Mood normal.     ?   Behavior: Behavior normal.     ?   Thought Content: Thought content normal.     ?   Judgment: Judgment normal.  ? ? ?CBC ?   ?Component Value Date/Time  ? WBC 8.4 12/12/2016 1830  ? RBC 5.15 12/12/2016 1830  ? HGB 16.5 12/12/2016 1830  ? HCT 45.8 12/12/2016 1830  ? PLT 150 12/12/2016 1830  ? MCV  88.9 12/12/2016 1830  ? MCH 32.0 12/12/2016 1830  ? MCHC 36.0 12/12/2016 1830  ? RDW 13.2 12/12/2016 1830  ? ?Chest imaging: ?CXR 03/2015 ?No evidence of acute cardiopulmonary disease. ? ?PFT: ?No flowsheet data found. ? ?Labs: ? ?Path: ? ?Echo: ? ?Heart Catheterization: ? ?Assessment & Plan:  ? ?Mild intermittent reactive airway disease without complication - Plan: levalbuterol (XOPENEX HFA) 45 MCG/ACT inhaler, Pulmonary Function Test ? ?Seasonal allergic rhinitis, unspecified trigger - Plan: fluticasone (FLONASE) 50 MCG/ACT nasal spray ? ?Discussion: ?Jabron Weese is a 51 year old male, never smoker with GERD, OSA and hypertension who is referred to pulmonary clinic for asthma.  ? ?He appears to have more significant sinus disease involvement at this time that could be a trigger to his reactive airways.  He does have significant secondhand smoke in childhood which does predispose him to reactive airways disease/asthma. ? ?Take Zyrtec or Allegra daily along with fluticasone nasal spray 1 to 2 sprays per nostril daily for his rhinitis and postnasal drainage.  I have sent in prescription of Xopenex due to his intolerance of albuterol that he can use as needed for cough, wheezing or shortness of breath. ? ?We will check pulmonary function tests at follow-up in 4 to 6 weeks. ? ?Melody Comas, MD ?[facility] ?Office: [phone] ? ? ?Current Outpatient Medications:  ?  fluticasone (FLONASE) 50 MCG/ACT nasal spray, Place 1 spray into both nostrils daily., Disp: 16 g, Rfl: 2 ?  levalbuterol (XOPENEX HFA) 45 MCG/ACT inhaler, Inhale  1-2 puffs into the lungs every 6 (six) hours as needed for wheezing., Disp: 1 each, Rfl: 12 ?  olmesartan (BENICAR) 20 MG tablet, Take 20 mg by mouth daily., Disp: , Rfl:  ?  omeprazole (PRILOSEC) 20 MG capsule,

## 2021-06-13 NOTE — Patient Instructions (Addendum)
Use xopenex inhaler 1-2 puffs every 4-6 hours for cough, wheezing, shortness of breath.  ? ?Use fluticasone nasal spray, 1-2 sprays per nostril daily ? ?Start zyrtec or allegra allergy medicine daily ? ?We will check pulmonary function tests in 4-6 weeks ? ?   ? ? ?

## 2021-09-01 ENCOUNTER — Other Ambulatory Visit (INDEPENDENT_AMBULATORY_CARE_PROVIDER_SITE_OTHER): Payer: BC Managed Care – PPO

## 2021-09-01 ENCOUNTER — Encounter: Payer: Self-pay | Admitting: Pulmonary Disease

## 2021-09-01 ENCOUNTER — Ambulatory Visit (INDEPENDENT_AMBULATORY_CARE_PROVIDER_SITE_OTHER): Payer: BC Managed Care – PPO | Admitting: Pulmonary Disease

## 2021-09-01 ENCOUNTER — Ambulatory Visit: Payer: BC Managed Care – PPO | Admitting: Pulmonary Disease

## 2021-09-01 VITALS — BP 118/84 | HR 92 | Ht 74.0 in | Wt 234.2 lb

## 2021-09-01 DIAGNOSIS — R942 Abnormal results of pulmonary function studies: Secondary | ICD-10-CM

## 2021-09-01 DIAGNOSIS — M2550 Pain in unspecified joint: Secondary | ICD-10-CM

## 2021-09-01 DIAGNOSIS — J984 Other disorders of lung: Secondary | ICD-10-CM

## 2021-09-01 DIAGNOSIS — J4520 Mild intermittent asthma, uncomplicated: Secondary | ICD-10-CM

## 2021-09-01 LAB — COMPREHENSIVE METABOLIC PANEL
ALT: 28 U/L (ref 0–53)
AST: 27 U/L (ref 0–37)
Albumin: 4.7 g/dL (ref 3.5–5.2)
Alkaline Phosphatase: 79 U/L (ref 39–117)
BUN: 17 mg/dL (ref 6–23)
CO2: 25 mEq/L (ref 19–32)
Calcium: 9.5 mg/dL (ref 8.4–10.5)
Chloride: 104 mEq/L (ref 96–112)
Creatinine, Ser: 1.2 mg/dL (ref 0.40–1.50)
GFR: 69.37 mL/min (ref >60.00)
Glucose, Bld: 138 mg/dL — ABNORMAL HIGH (ref 70–99)
Potassium: 3.4 mEq/L — ABNORMAL LOW (ref 3.5–5.1)
Sodium: 138 mEq/L (ref 135–145)
Total Bilirubin: 1.5 mg/dL — ABNORMAL HIGH (ref 0.2–1.2)
Total Protein: 7.6 g/dL (ref 6.0–8.3)

## 2021-09-01 LAB — PULMONARY FUNCTION TEST
DL/VA % pred: 88 %
DL/VA: 3.83 ml/min/mmHg/L
DLCO cor % pred: 68 %
DLCO cor: 22.5 ml/min/mmHg
DLCO unc % pred: 68 %
DLCO unc: 22.5 ml/min/mmHg
FEF 25-75 Post: 4.52 L/sec
FEF 25-75 Pre: 4.05 L/sec
FEF2575-%Change-Post: 11 %
FEF2575-%Pred-Post: 120 %
FEF2575-%Pred-Pre: 107 %
FEV1-%Change-Post: 1 %
FEV1-%Pred-Post: 76 %
FEV1-%Pred-Pre: 74 %
FEV1-Post: 3.36 L
FEV1-Pre: 3.29 L
FEV1FVC-%Change-Post: 2 %
FEV1FVC-%Pred-Pre: 109 %
FEV6-%Change-Post: -1 %
FEV6-%Pred-Post: 69 %
FEV6-%Pred-Pre: 70 %
FEV6-Post: 3.82 L
FEV6-Pre: 3.9 L
FEV6FVC-%Change-Post: 0 %
FEV6FVC-%Pred-Post: 102 %
FEV6FVC-%Pred-Pre: 103 %
FVC-%Change-Post: 0 %
FVC-%Pred-Post: 67 %
FVC-%Pred-Pre: 68 %
FVC-Post: 3.86 L
FVC-Pre: 3.9 L
Post FEV1/FVC ratio: 87 %
Post FEV6/FVC ratio: 99 %
Pre FEV1/FVC ratio: 84 %
Pre FEV6/FVC Ratio: 100 %
RV % pred: 61 %
RV: 1.41 L
TLC % pred: 71 %
TLC: 5.59 L

## 2021-09-01 MED ORDER — TRELEGY ELLIPTA 100-62.5-25 MCG/ACT IN AEPB: 1.0000 | INHALATION_SPRAY | Freq: Every day | RESPIRATORY_TRACT | 0 refills | Status: DC

## 2021-09-01 NOTE — Progress Notes (Signed)
Synopsis: Referred in March 2023 for asthma, self referral  Subjective:   PATIENT ID: Zachary Delacruz: male DOB: 03-07-1971, MRN: [id number]  HPI  Chief Complaint  Patient presents with   Follow-up    F/U after PFT. States his breathing has been stable since last visit.    Zachary Delacruz is a 51 year old male, never smoker with GERD, OSA and hypertension who returns to pulmonary clinic for shortness of breath.   He started using fluticasone nasal spray at last visit and PRN xopenex for mild intermittent wheezing. He has used the inhaler 3-4 times since last visit. He reports his symptoms remain similar to last visit.  PFTs today show mild restriction and mild diffusion defect.  He does complain of diffuse joint aches that wax and wane, which has been over the last few years. He reports he can have joint pains involving his knees, hands and shoulders at various times.   No family history of autoimmune disease.   Initial OV 06/13/21 He reports being diagnosed with asthma a few years ago.  He has noticed shortness of breath when bending over to tie his shoes and also with exertional activities such as this cough.  He reports having to stop playing disc golf a couple times due to the shortness of breath.  He reports having to upper respiratory infections at least twice per year with significant sinus congestion and postnasal drainage.  Outside of these episodes of respiratory infections he has low baseline sinus congestion and drainage and intermittent shortness of breath.  Reports cough and wheezing during acute infections.  Denies any nighttime awakenings due to cough, shortness of breath or wheezing.  He was most recently treated for upper respiratory infection with prednisone and azithromycin about 3 weeks ago.  He has been evaluated by Dr. Alcide Clever of [facility] pulmonology previously and was trialed on Trelegy Ellipta 1 puff daily and albuterol inhaler as needed.  He has not taken Trelegy  in the past year and he reports albuterol inhaler makes him feel anxious/jittery.  He has history of GERD which is well controlled on daily omeprazole.  He is a never smoker but reports significant secondhand smoke from his parents in childhood.  His sister has asthma since childhood and is currently followed at [facility] pulmonary.  His father had COPD/emphysema.  He works as an Animal nutritionist.  Past Medical History:  Diagnosis Date   GERD (gastroesophageal reflux disease)    Headache    Hyperlipemia    Hypertension    OSA (obstructive sleep apnea)      Family History  Problem Relation Age of Onset   Healthy Mother    Cirrhosis Father        died at age 40   COPD Father    Asthma Sister    Premature CHD Neg Hx      Social History   Socioeconomic History   Marital status: Single    Spouse name: Not on file   Number of children: 4   Years of education: 12   Highest education level: High school graduate  Occupational History   Occupation: Archivist  Tobacco Use   Smoking status: Never    Passive exposure: Past   Smokeless tobacco: Never  Substance and Sexual Activity   Alcohol use: Yes    Comment: rarely   Drug use: No   Sexual activity: Not on file  Other Topics Concern   Not on file  Social History Narrative   Lives  at home with his significant other, Bynum Bellows.   Right-handed.   One cup caffeine per day.   Social Determinants of Health   Financial Resource Strain: Not on file  Food Insecurity: Not on file  Transportation Needs: Not on file  Physical Activity: Not on file  Stress: Not on file  Social Connections: Not on file  Intimate Partner Violence: Not on file     Allergies  Allergen Reactions   Lisinopril     Other reaction(s): Other (see comments) Cough   Metoclopramide Hcl Other (See Comments)    tachycardia     Outpatient Medications Prior to Visit  Medication Sig Dispense Refill   fluticasone (FLONASE) 50 MCG/ACT nasal spray Place 1  spray into both nostrils daily. 16 g 2   levalbuterol (XOPENEX HFA) 45 MCG/ACT inhaler Inhale 1-2 puffs into the lungs every 6 (six) hours as needed for wheezing. 1 each 12   olmesartan (BENICAR) 20 MG tablet Take 20 mg by mouth daily.     omeprazole (PRILOSEC) 20 MG capsule Take 20 mg by mouth daily.     No facility-administered medications prior to visit.   Review of Systems  Constitutional:  Negative for chills, fever, malaise/fatigue and weight loss.  HENT:  Positive for congestion. Negative for sinus pain and sore throat.   Eyes: Negative.   Respiratory:  Positive for cough, sputum production and shortness of breath. Negative for hemoptysis and wheezing.   Cardiovascular:  Negative for chest pain, palpitations, orthopnea, claudication and leg swelling.  Gastrointestinal:  Negative for abdominal pain, heartburn, nausea and vomiting.  Genitourinary: Negative.   Musculoskeletal:  Positive for joint pain. Negative for myalgias.  Skin:  Negative for rash.  Neurological:  Positive for headaches. Negative for weakness.  Endo/Heme/Allergies: Negative.   Psychiatric/Behavioral: Negative.     Objective:   Vitals:   09/01/21 1353  BP: 118/84  Pulse: 92  SpO2: 100%  Weight: 234 lb 3.2 oz (106.2 kg)  Height: _0  (1.88 m)     Physical Exam Constitutional:      General: He is not in acute distress. HENT:     Head: Normocephalic and atraumatic.  Eyes:     Conjunctiva/sclera: Conjunctivae normal.  Cardiovascular:     Rate and Rhythm: Normal rate and regular rhythm.     Pulses: Normal pulses.     Heart sounds: Normal heart sounds. No murmur heard. Pulmonary:     Effort: Pulmonary effort is normal.     Breath sounds: Normal breath sounds. Decreased air movement present.  Musculoskeletal:     Right lower leg: No edema.     Left lower leg: No edema.  Skin:    General: Skin is warm and dry.  Neurological:     General: No focal deficit present.     Mental Status: He is alert.   Psychiatric:        Mood and Affect: Mood normal.        Behavior: Behavior normal.        Thought Content: Thought content normal.        Judgment: Judgment normal.    CBC    Component Value Date/Time   WBC 8.4 12/12/2016 1830   RBC 5.15 12/12/2016 1830   HGB 16.5 12/12/2016 1830   HCT 45.8 12/12/2016 1830   PLT 150 12/12/2016 1830   MCV 88.9 12/12/2016 1830   MCH 32.0 12/12/2016 1830   MCHC 36.0 12/12/2016 1830   RDW 13.2 12/12/2016 1830  Chest imaging: CXR 03/2015 No evidence of acute cardiopulmonary disease.  PFT:    Latest Ref Rng & Units 09/01/2021   12:46 PM  PFT Results  FVC-Pre L 3.90  P  FVC-Predicted Pre % 68  P  FVC-Post L 3.86  P  FVC-Predicted Post % 67  P  Pre FEV1/FVC % % 84  P  Post FEV1/FCV % % 87  P  FEV1-Pre L 3.29  P  FEV1-Predicted Pre % 74  P  FEV1-Post L 3.36  P  DLCO uncorrected ml/min/mmHg 22.50  P  DLCO UNC% % 68  P  DLCO corrected ml/min/mmHg 22.50  P  DLCO COR %Predicted % 68  P  DLVA Predicted % 88  P  TLC L 5.59  P  TLC % Predicted % 71  P  RV % Predicted % 61  P    P Preliminary result    Labs:  Path:  Echo:  Heart Catheterization:  Assessment & Plan:   Restrictive lung disease - Plan: ANA, ANCA screen with reflex titer, Anti-DNA antibody, double-stranded, Anti-scleroderma antibody, Anti-Smith antibody, Centromere Antibodies, Cyclic citrul peptide antibody, IgG, Hypersensitivity Pneumonitis, Rheumatoid factor, RNP Antibodies, Sjogren's syndrome antibods(ssa + ssb), CBC with Differential, Comp Met (CMET), CT CHEST HIGH RESOLUTION  Decreased diffusion capacity of lung - Plan: CT CHEST HIGH RESOLUTION  Arthralgia, unspecified joint - Plan: ANA, ANCA screen with reflex titer, Anti-DNA antibody, double-stranded, Anti-scleroderma antibody, Anti-Smith antibody, Centromere Antibodies, Cyclic citrul peptide antibody, IgG, Hypersensitivity Pneumonitis, Rheumatoid factor, RNP Antibodies, Sjogren's syndrome antibods(ssa + ssb), CBC  with Differential, Comp Met (CMET), CT CHEST HIGH RESOLUTION  Discussion: Eulis Salazar is a 51 year old male, never smoker with GERD, OSA and hypertension who is referred to pulmonary clinic for shortness of breath.   He has mild diffusion defect and restriction on PFTs today. We will check a high resolution CT Chest scan for further evaluation. Given his complaints of diffuse joint pains over recent years we will also check an inflammatory lab panel today.   We will trial him on trelegy ellipta 1 puff daily and monitor for any change in his symptoms.   Follow up in 3 weeks to review CT chest scan and labs.   Freda Jackson, MD [facility] Office: [phone]   Current Outpatient Medications:    fluticasone (FLONASE) 50 MCG/ACT nasal spray, Place 1 spray into both nostrils daily., Disp: 16 g, Rfl: 2   levalbuterol (XOPENEX HFA) 45 MCG/ACT inhaler, Inhale 1-2 puffs into the lungs every 6 (six) hours as needed for wheezing., Disp: 1 each, Rfl: 12   olmesartan (BENICAR) 20 MG tablet, Take 20 mg by mouth daily., Disp: , Rfl:    omeprazole (PRILOSEC) 20 MG capsule, Take 20 mg by mouth daily., Disp: , Rfl:

## 2021-09-01 NOTE — Patient Instructions (Addendum)
Your breathing tests show mild restrictive defect and mild diffusion defect. This means the lungs are stiffer than normal and that oxygen is having a harder time traveling through the lung tissue.   We will check a CT Chest scan to take a closer look at the lungs  We will check an inflammatory lab panel today since you have these findings on your breathing tests and complaint of diffuse joint pains  Follow up on 6/23 to review the CT and lab work

## 2021-09-01 NOTE — Progress Notes (Signed)
PFT done today. 

## 2021-09-01 NOTE — Addendum Note (Signed)
Addended by: Valerie Salts on: 09/01/2021 02:50 PM   Modules accepted: Orders

## 2021-09-06 LAB — SJOGREN'S SYNDROME ANTIBODS(SSA + SSB)
SSA (Ro) (ENA) Antibody, IgG: <1 AI
SSB (La) (ENA) Antibody, IgG: <1 AI

## 2021-09-06 LAB — CYCLIC CITRUL PEPTIDE ANTIBODY, IGG: Cyclic Citrullin Peptide Ab: <16 UNITS

## 2021-09-06 LAB — ANA: Anti Nuclear Antibody (ANA): NEGATIVE

## 2021-09-06 LAB — ANCA SCREEN W REFLEX TITER: ANCA SCREEN: NEGATIVE

## 2021-09-06 LAB — CENTROMERE ANTIBODIES: Centromere Ab Screen: <1 AI

## 2021-09-06 LAB — ANTI-DNA ANTIBODY, DOUBLE-STRANDED: ds DNA Ab: 1 IU/mL

## 2021-09-06 LAB — ANTI-SCLERODERMA ANTIBODY: Scleroderma (Scl-70) (ENA) Antibody, IgG: <1 AI

## 2021-09-06 LAB — RHEUMATOID FACTOR: Rheumatoid fact SerPl-aCnc: <14 IU/mL (ref <14)

## 2021-09-06 LAB — ANTI-SMITH ANTIBODY: ENA SM Ab Ser-aCnc: <1 AI

## 2021-09-08 LAB — HYPERSENSITIVITY PNEUMONITIS
A. Pullulans Abs: NEGATIVE
A.Fumigatus #1 Abs: NEGATIVE
Micropolyspora faeni, IgG: NEGATIVE
Pigeon Serum Abs: NEGATIVE
Thermoact. Saccharii: NEGATIVE
Thermoactinomyces vulgaris, IgG: NEGATIVE

## 2021-09-08 LAB — RNP ANTIBODIES: ENA RNP Ab: 0.2 AI (ref 0.0–0.9)

## 2021-09-13 ENCOUNTER — Ambulatory Visit (INDEPENDENT_AMBULATORY_CARE_PROVIDER_SITE_OTHER)
Admission: RE | Admit: 2021-09-13 | Discharge: 2021-09-13 | Disposition: A | Discharge status: Home / Self Care | Payer: BC Managed Care – PPO | Source: Ambulatory Visit | Attending: Pulmonary Disease | Admitting: Pulmonary Disease

## 2021-09-13 DIAGNOSIS — J8410 Pulmonary fibrosis, unspecified: Secondary | ICD-10-CM | POA: Diagnosis not present

## 2021-09-13 DIAGNOSIS — R942 Abnormal results of pulmonary function studies: Secondary | ICD-10-CM

## 2021-09-13 DIAGNOSIS — J984 Other disorders of lung: Secondary | ICD-10-CM | POA: Diagnosis not present

## 2021-09-13 DIAGNOSIS — R918 Other nonspecific abnormal finding of lung field: Secondary | ICD-10-CM | POA: Diagnosis not present

## 2021-09-13 DIAGNOSIS — I700 Atherosclerosis of aorta: Secondary | ICD-10-CM | POA: Diagnosis not present

## 2021-09-13 DIAGNOSIS — K449 Diaphragmatic hernia without obstruction or gangrene: Secondary | ICD-10-CM | POA: Diagnosis not present

## 2021-09-13 DIAGNOSIS — M2550 Pain in unspecified joint: Secondary | ICD-10-CM

## 2021-09-22 ENCOUNTER — Ambulatory Visit: Payer: BC Managed Care – PPO | Admitting: Pulmonary Disease

## 2021-09-22 ENCOUNTER — Encounter: Payer: Self-pay | Admitting: Pulmonary Disease

## 2021-09-22 VITALS — BP 120/72 | HR 80 | Ht 74.0 in | Wt 233.2 lb

## 2021-09-22 DIAGNOSIS — R942 Abnormal results of pulmonary function studies: Secondary | ICD-10-CM

## 2021-09-22 DIAGNOSIS — R0602 Shortness of breath: Secondary | ICD-10-CM | POA: Diagnosis not present

## 2021-09-22 DIAGNOSIS — J984 Other disorders of lung: Secondary | ICD-10-CM | POA: Diagnosis not present

## 2021-09-23 ENCOUNTER — Encounter: Payer: Self-pay | Admitting: Pulmonary Disease

## 2021-10-04 ENCOUNTER — Ambulatory Visit (INDEPENDENT_AMBULATORY_CARE_PROVIDER_SITE_OTHER): Payer: BC Managed Care – PPO

## 2021-10-04 ENCOUNTER — Other Ambulatory Visit (HOSPITAL_COMMUNITY): Payer: BC Managed Care – PPO

## 2021-10-04 DIAGNOSIS — R0602 Shortness of breath: Secondary | ICD-10-CM

## 2021-10-04 DIAGNOSIS — R942 Abnormal results of pulmonary function studies: Secondary | ICD-10-CM

## 2021-10-04 LAB — ECHOCARDIOGRAM COMPLETE
Area-P 1/2: 3.51 cm2
S' Lateral: 3 cm

## 2022-01-24 ENCOUNTER — Telehealth: Payer: Self-pay | Admitting: Pulmonary Disease

## 2022-01-24 NOTE — Telephone Encounter (Signed)
Called patient and left voicemail for him to call office back in regards to CT scan he is wanting.

## 2022-01-24 NOTE — Telephone Encounter (Signed)
Called patient and went over how Dr Erin Fulling wants to go over with him at follow up in new CT is needed. Got him schedule for   December 8,2023 At  8:45am  Nothing further needed

## 2022-03-09 ENCOUNTER — Ambulatory Visit: Payer: BC Managed Care – PPO | Admitting: Pulmonary Disease

## 2022-03-09 NOTE — Progress Notes (Deleted)
Synopsis: Referred in March 2023 for asthma, self referral  Subjective:   PATIENT ID: Rico Junker GENDER: male DOB: 1971-01-06, MRN: [id number]  HPI  No chief complaint on file.  Shariq Puig is a 51 year old male, never smoker with GERD, OSA and hypertension who returns to pulmonary clinic for shortness of breath.     OV 09/22/21 HRCT Chest showed very mild fibrotic changes in the lung bases and scattered 1-51mm pulmonary nodules bilaterally.   Inflammatory lab evaluation is unremarkable.  No change in his symptoms.  OV 09/01/21 He started using fluticasone nasal spray at last visit and PRN xopenex for mild intermittent wheezing. He has used the inhaler 3-4 times since last visit. He reports his symptoms remain similar to last visit.  PFTs today show mild restriction and mild diffusion defect.  He does complain of diffuse joint aches that wax and wane, which has been over the last few years. He reports he can have joint pains involving his knees, hands and shoulders at various times.   No family history of autoimmune disease.   Initial OV 06/13/21 He reports being diagnosed with asthma a few years ago.  He has noticed shortness of breath when bending over to tie his shoes and also with exertional activities such as this cough.  He reports having to stop playing disc golf a couple times due to the shortness of breath.  He reports having to upper respiratory infections at least twice per year with significant sinus congestion and postnasal drainage.  Outside of these episodes of respiratory infections he has low baseline sinus congestion and drainage and intermittent shortness of breath.  Reports cough and wheezing during acute infections.  Denies any nighttime awakenings due to cough, shortness of breath or wheezing.  He was most recently treated for upper respiratory infection with prednisone and azithromycin about 3 weeks ago.  He has been evaluated by Dr. Blenda Nicely of [facility]  pulmonology previously and was trialed on Trelegy Ellipta 1 puff daily and albuterol inhaler as needed.  He has not taken Trelegy in the past year and he reports albuterol inhaler makes him feel anxious/jittery.  He has history of GERD which is well controlled on daily omeprazole.  He is a never smoker but reports significant secondhand smoke from his parents in childhood.  His sister has asthma since childhood and is currently followed at [facility] pulmonary.  His father had COPD/emphysema.  He works as an Probation officer.  Past Medical History:  Diagnosis Date   GERD (gastroesophageal reflux disease)    Headache    Hyperlipemia    Hypertension    OSA (obstructive sleep apnea)      Family History  Problem Relation Age of Onset   Healthy Mother    Cirrhosis Father        died at age 43   COPD Father    Asthma Sister    Premature CHD Neg Hx      Social History   Socioeconomic History   Marital status: Single    Spouse name: Not on file   Number of children: 4   Years of education: 12   Highest education level: High school graduate  Occupational History   Occupation: Building surveyor  Tobacco Use   Smoking status: Never    Passive exposure: Past   Smokeless tobacco: Never  Substance and Sexual Activity   Alcohol use: Yes    Comment: rarely   Drug use: No   Sexual activity: Not on file  Other Topics Concern   Not on file  Social History Narrative   Lives at home with his significant other, Bynum Bellows.   Right-handed.   One cup caffeine per day.   Social Determinants of Health   Financial Resource Strain: Not on file  Food Insecurity: Not on file  Transportation Needs: Not on file  Physical Activity: Not on file  Stress: Not on file  Social Connections: Not on file  Intimate Partner Violence: Not on file     Allergies  Allergen Reactions   Lisinopril     Other reaction(s): Other (see comments) Cough   Metoclopramide Hcl Other (See Comments)    tachycardia      Outpatient Medications Prior to Visit  Medication Sig Dispense Refill   fluticasone (FLONASE) 50 MCG/ACT nasal spray Place 1 spray into both nostrils daily. 16 g 2   Fluticasone-Umeclidin-Vilant (TRELEGY ELLIPTA) 100-62.5-25 MCG/ACT AEPB Inhale 1 puff into the lungs daily. 2 each 0   levalbuterol (XOPENEX HFA) 45 MCG/ACT inhaler Inhale 1-2 puffs into the lungs every 6 (six) hours as needed for wheezing. 1 each 12   olmesartan (BENICAR) 20 MG tablet Take 20 mg by mouth daily.     omeprazole (PRILOSEC) 20 MG capsule Take 20 mg by mouth daily.     No facility-administered medications prior to visit.   Review of Systems  Constitutional:  Negative for chills, fever, malaise/fatigue and weight loss.  HENT:  Positive for congestion. Negative for sinus pain and sore throat.   Eyes: Negative.   Respiratory:  Positive for cough and shortness of breath. Negative for hemoptysis, sputum production and wheezing.   Cardiovascular:  Negative for chest pain, palpitations, orthopnea, claudication and leg swelling.  Gastrointestinal:  Negative for abdominal pain, heartburn, nausea and vomiting.  Genitourinary: Negative.   Musculoskeletal:  Positive for joint pain. Negative for myalgias.  Skin:  Negative for rash.  Neurological:  Positive for headaches. Negative for weakness.  Endo/Heme/Allergies: Negative.   Psychiatric/Behavioral: Negative.      Objective:   There were no vitals filed for this visit.  Physical Exam Constitutional:      General: He is not in acute distress. HENT:     Head: Normocephalic and atraumatic.  Eyes:     Conjunctiva/sclera: Conjunctivae normal.  Cardiovascular:     Rate and Rhythm: Normal rate and regular rhythm.     Pulses: Normal pulses.     Heart sounds: Normal heart sounds. No murmur heard. Pulmonary:     Effort: Pulmonary effort is normal.     Breath sounds: Decreased air movement present. Rales present.  Musculoskeletal:     Right lower leg: No edema.      Left lower leg: No edema.  Skin:    General: Skin is warm and dry.  Neurological:     General: No focal deficit present.     Mental Status: He is alert.  Psychiatric:        Mood and Affect: Mood normal.        Behavior: Behavior normal.        Thought Content: Thought content normal.        Judgment: Judgment normal.    CBC    Component Value Date/Time   WBC 8.4 12/12/2016 1830   RBC 5.15 12/12/2016 1830   HGB 16.5 12/12/2016 1830   HCT 45.8 12/12/2016 1830   PLT 150 12/12/2016 1830   MCV 88.9 12/12/2016 1830   MCH 32.0 12/12/2016 1830   MCHC 36.0  12/12/2016 1830   RDW 13.2 12/12/2016 1830   Chest imaging: HRCT Chest 09/13/21 1. Extremely mild fibrotic changes in the lung bases, which could simply reflect areas of mild chronic post infectious or inflammatory scarring. Early interstitial lung disease is not excluded, but not strongly favored, and findings on today's examination would be categorized as indeterminate for usual interstitial pneumonia (UIP) per current ATS guidelines. If there is persistent clinical concern for interstitial lung disease, repeat high-resolution chest CT should be considered in 12-24 months to assess for temporal changes in the appearance of the lung parenchyma. 2. Scattered tiny 1-3 mm pulmonary nodules in the lungs bilaterally, nonspecific, but statistically likely benign. No follow-up needed if patient is low-risk (and has no known or suspected primary neoplasm). Non-contrast chest CT can be considered in 12 months if patient is high-risk. This recommendation follows the consensus statement: Guidelines for Management of Incidental Pulmonary Nodules Detected on CT Images: From the Fleischner Society 2017; Radiology 2017; 284:228-243. 3. Aortic atherosclerosis.  CXR 03/2015 No evidence of acute cardiopulmonary disease.  PFT:    Latest Ref Rng & Units 09/01/2021   12:46 PM  PFT Results  FVC-Pre L 3.90   FVC-Predicted Pre % 68    FVC-Post L 3.86   FVC-Predicted Post % 67   Pre FEV1/FVC % % 84   Post FEV1/FCV % % 87   FEV1-Pre L 3.29   FEV1-Predicted Pre % 74   FEV1-Post L 3.36   DLCO uncorrected ml/min/mmHg 22.50   DLCO UNC% % 68   DLCO corrected ml/min/mmHg 22.50   DLCO COR %Predicted % 68   DLVA Predicted % 88   TLC L 5.59   TLC % Predicted % 71   RV % Predicted % 61   PFTs 2023: Mild restriction and mild diffusion defect  Labs: 09/01/21 ANA - negative ANCA - negative CCP - negative dsDNA- negative RF - negative SSA/SSB - negative Scl-70 negative  Path:  Echo 09/2021: LV EF 60-65%. RV size and systolic function are normal.   Heart Catheterization:  Assessment & Plan:   No diagnosis found.  Discussion: Mallie Carosella is a 51 year old male, never smoker with GERD, OSA and hypertension who returns to pulmonary clinic for shortness of breath.   He has mild diffusion defect and restriction on PFTs today. HRCT chest shows mild fibrotic changes at the bases and scattered 1-71mm nodules bilaterally. He has bibasilar rales on exam. Inflammatory lab workup is unrevealing.  We will continue to monitor closely incase this is early development of ILD.   We will check an ECHO to evaluate for pulmonary hypertension based on a previous echo in 2018 which noted mildly elevated PASP.   Follow up in 6 months.   Freda Jackson, MD [facility] Office: [phone]   Current Outpatient Medications:    fluticasone (FLONASE) 50 MCG/ACT nasal spray, Place 1 spray into both nostrils daily., Disp: 16 g, Rfl: 2   Fluticasone-Umeclidin-Vilant (TRELEGY ELLIPTA) 100-62.5-25 MCG/ACT AEPB, Inhale 1 puff into the lungs daily., Disp: 2 each, Rfl: 0   levalbuterol (XOPENEX HFA) 45 MCG/ACT inhaler, Inhale 1-2 puffs into the lungs every 6 (six) hours as needed for wheezing., Disp: 1 each, Rfl: 12   olmesartan (BENICAR) 20 MG tablet, Take 20 mg by mouth daily., Disp: , Rfl:    omeprazole (PRILOSEC) 20  MG capsule, Take 20 mg by mouth daily., Disp: , Rfl:

## 2022-04-29 DIAGNOSIS — R0981 Nasal congestion: Secondary | ICD-10-CM | POA: Diagnosis not present

## 2022-04-29 DIAGNOSIS — R070 Pain in throat: Secondary | ICD-10-CM | POA: Diagnosis not present

## 2022-04-29 DIAGNOSIS — J988 Other specified respiratory disorders: Secondary | ICD-10-CM | POA: Diagnosis not present

## 2022-04-29 DIAGNOSIS — R052 Subacute cough: Secondary | ICD-10-CM | POA: Diagnosis not present

## 2022-05-11 ENCOUNTER — Ambulatory Visit: Payer: BC Managed Care – PPO | Admitting: Pulmonary Disease

## 2022-05-11 ENCOUNTER — Encounter: Payer: Self-pay | Admitting: Pulmonary Disease

## 2022-05-11 VITALS — BP 120/86 | HR 92 | Ht 74.0 in | Wt 236.4 lb

## 2022-05-11 DIAGNOSIS — J984 Other disorders of lung: Secondary | ICD-10-CM

## 2022-05-11 DIAGNOSIS — J4530 Mild persistent asthma, uncomplicated: Secondary | ICD-10-CM

## 2022-05-11 DIAGNOSIS — R942 Abnormal results of pulmonary function studies: Secondary | ICD-10-CM

## 2022-05-11 MED ORDER — AEROCHAMBER MV MISC: 0 refills | Status: AC

## 2022-05-11 MED ORDER — PREDNISONE 10 MG PO TABS: ORAL_TABLET | ORAL | 0 refills | Status: AC

## 2022-05-11 MED ORDER — FLUTICASONE-SALMETEROL 115-21 MCG/ACT IN AERO: 2.0000 | INHALATION_SPRAY | Freq: Two times a day (BID) | RESPIRATORY_TRACT | 12 refills | Status: DC

## 2022-05-11 NOTE — Addendum Note (Signed)
Addended by: Valerie Salts on: 05/11/2022 04:07 PM   Modules accepted: Orders

## 2022-05-11 NOTE — Patient Instructions (Addendum)
Prednisone Taper: 70m daily x 3 days 372mdaily x 3 days 2061maily x 3 days 7m28mily x 3 days  Start advair HFA inhaler 2 puffs twice daily with spacer - rinse mouth out after each use  We will schedule you for CT Chest scan in 6 months  Follow up in 6 months after CT scan with pulmonary function tests

## 2022-05-11 NOTE — Progress Notes (Signed)
Synopsis: Referred in March 2023 for asthma, self referral  Subjective:   PATIENT ID: Zachary Delacruz: male DOB: 24-Dec-1970, MRN: [id number]  HPI  Chief Complaint  Patient presents with   Follow-up    6 mo f/u. States his SOB has increased since last visit. The SOB occurs at random times. Increased chest tightness.    Prather Linenberger is a 52 year old male, never smoker with GERD, OSA and hypertension who returns to pulmonary clinic for shortness of breath.   He continues to have shortness of breath and cough. The cough is mainly dry. He has not tolerated trelegy inhaler in the past due to oral irritation side effects.  09/22/21 HRCT Chest showed very mild fibrotic changes in the lung bases and scattered 1-71m pulmonary nodules bilaterally.   Inflammatory lab evaluation is unremarkable.  No change in his symptoms.  OV 09/01/21 He started using fluticasone nasal spray at last visit and PRN xopenex for mild intermittent wheezing. He has used the inhaler 3-4 times since last visit. He reports his symptoms remain similar to last visit.  PFTs today show mild restriction and mild diffusion defect.  He does complain of diffuse joint aches that wax and wane, which has been over the last few years. He reports he can have joint pains involving his knees, hands and shoulders at various times.   No family history of autoimmune disease.   Initial OV 06/13/21 He reports being diagnosed with asthma a few years ago.  He has noticed shortness of breath when bending over to tie his shoes and also with exertional activities such as this cough.  He reports having to stop playing disc golf a couple times due to the shortness of breath.  He reports having to upper respiratory infections at least twice per year with significant sinus congestion and postnasal drainage.  Outside of these episodes of respiratory infections he has low baseline sinus congestion and drainage and intermittent shortness of breath.   Reports cough and wheezing during acute infections.  Denies any nighttime awakenings due to cough, shortness of breath or wheezing.  He was most recently treated for upper respiratory infection with prednisone and azithromycin about 3 weeks ago.  [facility] and was trialed on Trelegy Ellipta 1 puff daily and albuterol inhaler as needed.  He has not taken Trelegy in the past year and he reports albuterol inhaler makes him feel anxious/jittery.  He has history of GERD which is well controlled on daily omeprazole.  He is a never smoker but reports significant secondhand smoke from his parents in childhood.  His sister has asthma since childhood [facility]  His father had COPD/emphysema.  He works as an uAnimal nutritionist  Past Medical History:  Diagnosis Date   GERD (gastroesophageal reflux disease)    Headache    Hyperlipemia    Hypertension    OSA (obstructive sleep apnea)      Family History  Problem Relation Age of Onset   Healthy Mother    Cirrhosis Father        died at age 784  COPD Father    Asthma Sister    Premature CHD Neg Hx      Social History   Socioeconomic History   Marital status: Single    Spouse name: Not on file   Number of children: 4   Years of education: 12   Highest education level:  High school graduate  Occupational History   Occupation: Archivist  Tobacco Use   Smoking status: Never    Passive exposure: Past   Smokeless tobacco: Never  Substance and Sexual Activity   Alcohol use: Yes    Comment: rarely   Drug use: No   Sexual activity: Not on file  Other Topics Concern   Not on file  Social History Narrative   Lives at home with his significant other, Bynum Bellows.   Right-handed.   One cup caffeine per day.   Social Determinants of Health   Financial Resource Strain: Not on file  Food Insecurity: Not on file  Transportation Needs: Not on  file  Physical Activity: Not on file  Stress: Not on file  Social Connections: Not on file  Intimate Partner Violence: Not on file     Allergies  Allergen Reactions   Lisinopril     Other reaction(s): Other (see comments) Cough   Metoclopramide Hcl Other (See Comments)    tachycardia     Outpatient Medications Prior to Visit  Medication Sig Dispense Refill   fluticasone (FLONASE) 50 MCG/ACT nasal spray Place 1 spray into both nostrils daily. 16 g 2   levalbuterol (XOPENEX HFA) 45 MCG/ACT inhaler Inhale 1-2 puffs into the lungs every 6 (six) hours as needed for wheezing. 1 each 12   olmesartan (BENICAR) 20 MG tablet Take 20 mg by mouth daily.     omeprazole (PRILOSEC) 20 MG capsule Take 20 mg by mouth daily.     Fluticasone-Umeclidin-Vilant (TRELEGY ELLIPTA) 100-62.5-25 MCG/ACT AEPB Inhale 1 puff into the lungs daily. 2 each 0   No facility-administered medications prior to visit.   Review of Systems  Constitutional:  Negative for chills, fever, malaise/fatigue and weight loss.  HENT:  Negative for congestion, sinus pain and sore throat.   Eyes: Negative.   Respiratory:  Positive for cough and shortness of breath. Negative for hemoptysis, sputum production and wheezing.   Cardiovascular:  Negative for chest pain, palpitations, orthopnea, claudication and leg swelling.  Gastrointestinal:  Positive for heartburn. Negative for abdominal pain, nausea and vomiting.  Genitourinary: Negative.   Musculoskeletal:  Negative for joint pain and myalgias.  Skin:  Negative for rash.  Neurological:  Negative for weakness.  Endo/Heme/Allergies: Negative.   Psychiatric/Behavioral: Negative.      Objective:   Vitals:   05/11/22 1518  BP: 120/86  Pulse: 92  SpO2: 96%  Weight: 236 lb 6.4 oz (107.2 kg)  Height: 6' 2"$  (1.88 m)   Physical Exam Constitutional:      General: He is not in acute distress. HENT:     Head: Normocephalic and atraumatic.  Eyes:     Conjunctiva/sclera:  Conjunctivae normal.  Cardiovascular:     Rate and Rhythm: Normal rate and regular rhythm.     Pulses: Normal pulses.     Heart sounds: Normal heart sounds. No murmur heard. Pulmonary:     Effort: Pulmonary effort is normal.     Breath sounds: Decreased air movement present. No wheezing, rhonchi or rales.  Musculoskeletal:     Right lower leg: No edema.     Left lower leg: No edema.  Skin:    General: Skin is warm and dry.  Neurological:     General: No focal deficit present.     Mental Status: He is alert.  Psychiatric:        Mood and Affect: Mood normal.        Behavior: Behavior normal.  Thought Content: Thought content normal.        Judgment: Judgment normal.    CBC    Component Value Date/Time   WBC 8.4 12/12/2016 1830   RBC 5.15 12/12/2016 1830   HGB 16.5 12/12/2016 1830   HCT 45.8 12/12/2016 1830   PLT 150 12/12/2016 1830   MCV 88.9 12/12/2016 1830   MCH 32.0 12/12/2016 1830   MCHC 36.0 12/12/2016 1830   RDW 13.2 12/12/2016 1830   Chest imaging: HRCT Chest 09/13/21 1. Extremely mild fibrotic changes in the lung bases, which could simply reflect areas of mild chronic post infectious or inflammatory scarring. Early interstitial lung disease is not excluded, but not strongly favored, and findings on today's examination would be categorized as indeterminate for usual interstitial pneumonia (UIP) per current ATS guidelines. If there is persistent clinical concern for interstitial lung disease, repeat high-resolution chest CT should be considered in 12-24 months to assess for temporal changes in the appearance of the lung parenchyma. 2. Scattered tiny 1-3 mm pulmonary nodules in the lungs bilaterally, nonspecific, but statistically likely benign. No follow-up needed if patient is low-risk (and has no known or suspected primary neoplasm). Non-contrast chest CT can be considered in 12 months if patient is high-risk. This recommendation follows the  consensus statement: Guidelines for Management of Incidental Pulmonary Nodules Detected on CT Images: From the Fleischner Society 2017; Radiology 2017; 284:228-243. 3. Aortic atherosclerosis.  CXR 03/2015 No evidence of acute cardiopulmonary disease.  PFT:    Latest Ref Rng & Units 09/01/2021   12:46 PM  PFT Results  FVC-Pre L 3.90   FVC-Predicted Pre % 68   FVC-Post L 3.86   FVC-Predicted Post % 67   Pre FEV1/FVC % % 84   Post FEV1/FCV % % 87   FEV1-Pre L 3.29   FEV1-Predicted Pre % 74   FEV1-Post L 3.36   DLCO uncorrected ml/min/mmHg 22.50   DLCO UNC% % 68   DLCO corrected ml/min/mmHg 22.50   DLCO COR %Predicted % 68   DLVA Predicted % 88   TLC L 5.59   TLC % Predicted % 71   RV % Predicted % 61     Labs:  Path:  Echo:  Heart Catheterization:  Assessment & Plan:   Mild persistent reactive airway disease without complication - Plan: fluticasone-salmeterol (ADVAIR HFA) 115-21 MCG/ACT inhaler  Restrictive lung disease - Plan: predniSONE (DELTASONE) 10 MG tablet, Pulmonary Function Test, CT CHEST HIGH RESOLUTION  Decreased diffusion capacity of lung - Plan: Pulmonary Function Test, CT CHEST HIGH RESOLUTION  Discussion: Zachary Delacruz is a 52 year old male, never smoker with GERD, OSA and hypertension who returns to pulmonary clinic for shortness of breath.   He has mild diffusion defect and restriction on PFTs. HRCT chest shows mild fibrotic changes at the bases and scattered 1-32m nodules bilaterally. Inflammatory lab workup is unrevealing.  We will plan for repeat PFTs and HRCT Chest in 6 months for monitoring of possible early ILD.   We will trial him on extended prednisone taper and advair HFA 115-257m 2 puffs twice daily with spacer.   Echo 2023 is within normal limits, no concern for elevated PASP.   Follow up in 6 months.   JoFreda JacksonMD [facility] Office: [phone] Current Outpatient Medications:    fluticasone  (FLONASE) 50 MCG/ACT nasal spray, Place 1 spray into both nostrils daily., Disp: 16 g, Rfl: 2   fluticasone-salmeterol (ADVAIR HFA) 115-21 MCG/ACT inhaler, Inhale  2 puffs into the lungs 2 (two) times daily., Disp: 1 each, Rfl: 12   levalbuterol (XOPENEX HFA) 45 MCG/ACT inhaler, Inhale 1-2 puffs into the lungs every 6 (six) hours as needed for wheezing., Disp: 1 each, Rfl: 12   olmesartan (BENICAR) 20 MG tablet, Take 20 mg by mouth daily., Disp: , Rfl:    omeprazole (PRILOSEC) 20 MG capsule, Take 20 mg by mouth daily., Disp: , Rfl:    predniSONE (DELTASONE) 10 MG tablet, Take 4 tablets (40 mg total) by mouth daily with breakfast for 3 days, THEN 3 tablets (30 mg total) daily with breakfast for 3 days, THEN 2 tablets (20 mg total) daily with breakfast for 3 days, THEN 1 tablet (10 mg total) daily with breakfast for 3 days., Disp: 30 tablet, Rfl: 0

## 2022-07-03 DIAGNOSIS — L918 Other hypertrophic disorders of the skin: Secondary | ICD-10-CM | POA: Diagnosis not present

## 2022-07-03 DIAGNOSIS — Z129 Encounter for screening for malignant neoplasm, site unspecified: Secondary | ICD-10-CM | POA: Diagnosis not present

## 2022-07-03 DIAGNOSIS — L738 Other specified follicular disorders: Secondary | ICD-10-CM | POA: Diagnosis not present

## 2022-07-03 DIAGNOSIS — D235 Other benign neoplasm of skin of trunk: Secondary | ICD-10-CM | POA: Diagnosis not present

## 2022-07-03 DIAGNOSIS — L821 Other seborrheic keratosis: Secondary | ICD-10-CM | POA: Diagnosis not present

## 2022-07-03 DIAGNOSIS — D485 Neoplasm of uncertain behavior of skin: Secondary | ICD-10-CM | POA: Diagnosis not present

## 2022-07-06 DIAGNOSIS — D225 Melanocytic nevi of trunk: Secondary | ICD-10-CM | POA: Diagnosis not present

## 2022-08-20 DIAGNOSIS — J988 Other specified respiratory disorders: Secondary | ICD-10-CM | POA: Diagnosis not present

## 2022-08-20 DIAGNOSIS — B9689 Other specified bacterial agents as the cause of diseases classified elsewhere: Secondary | ICD-10-CM | POA: Diagnosis not present

## 2022-08-20 DIAGNOSIS — J019 Acute sinusitis, unspecified: Secondary | ICD-10-CM | POA: Diagnosis not present

## 2022-10-20 DIAGNOSIS — R600 Localized edema: Secondary | ICD-10-CM | POA: Diagnosis not present

## 2022-10-20 DIAGNOSIS — M79605 Pain in left leg: Secondary | ICD-10-CM | POA: Diagnosis not present

## 2022-10-20 DIAGNOSIS — R0602 Shortness of breath: Secondary | ICD-10-CM | POA: Diagnosis not present

## 2022-10-20 DIAGNOSIS — M79604 Pain in right leg: Secondary | ICD-10-CM | POA: Diagnosis not present

## 2022-11-13 DIAGNOSIS — M25561 Pain in right knee: Secondary | ICD-10-CM | POA: Diagnosis not present

## 2022-11-20 DIAGNOSIS — H40033 Anatomical narrow angle, bilateral: Secondary | ICD-10-CM | POA: Diagnosis not present

## 2022-11-20 DIAGNOSIS — H25811 Combined forms of age-related cataract, right eye: Secondary | ICD-10-CM | POA: Diagnosis not present

## 2022-11-23 DIAGNOSIS — R110 Nausea: Secondary | ICD-10-CM | POA: Diagnosis not present

## 2022-11-23 DIAGNOSIS — R519 Headache, unspecified: Secondary | ICD-10-CM | POA: Diagnosis not present

## 2022-11-23 DIAGNOSIS — Z136 Encounter for screening for cardiovascular disorders: Secondary | ICD-10-CM | POA: Diagnosis not present

## 2022-11-23 DIAGNOSIS — R059 Cough, unspecified: Secondary | ICD-10-CM | POA: Diagnosis not present

## 2022-11-23 DIAGNOSIS — Z20828 Contact with and (suspected) exposure to other viral communicable diseases: Secondary | ICD-10-CM | POA: Diagnosis not present

## 2022-11-23 DIAGNOSIS — Z1159 Encounter for screening for other viral diseases: Secondary | ICD-10-CM | POA: Diagnosis not present

## 2023-01-09 ENCOUNTER — Ambulatory Visit
Admission: RE | Admit: 2023-01-09 | Discharge: 2023-01-09 | Disposition: A | Discharge status: Home / Self Care | Payer: Commercial Managed Care - PPO | Source: Ambulatory Visit | Attending: Pulmonary Disease

## 2023-01-09 DIAGNOSIS — R942 Abnormal results of pulmonary function studies: Secondary | ICD-10-CM

## 2023-01-09 DIAGNOSIS — J984 Other disorders of lung: Secondary | ICD-10-CM

## 2023-03-21 ENCOUNTER — Ambulatory Visit: Payer: Commercial Managed Care - PPO | Admitting: Pulmonary Disease

## 2023-03-21 ENCOUNTER — Encounter (HOSPITAL_BASED_OUTPATIENT_CLINIC_OR_DEPARTMENT_OTHER): Payer: Commercial Managed Care - PPO

## 2023-05-09 ENCOUNTER — Ambulatory Visit: Payer: Commercial Managed Care - PPO | Admitting: Pulmonary Disease

## 2023-05-09 ENCOUNTER — Encounter: Payer: Self-pay | Admitting: Pulmonary Disease

## 2023-05-09 VITALS — BP 119/68 | HR 84 | Ht 74.0 in | Wt 235.0 lb

## 2023-05-09 DIAGNOSIS — Z8669 Personal history of other diseases of the nervous system and sense organs: Secondary | ICD-10-CM | POA: Diagnosis not present

## 2023-05-09 DIAGNOSIS — R942 Abnormal results of pulmonary function studies: Secondary | ICD-10-CM

## 2023-05-09 DIAGNOSIS — J4530 Mild persistent asthma, uncomplicated: Secondary | ICD-10-CM

## 2023-05-09 DIAGNOSIS — J984 Other disorders of lung: Secondary | ICD-10-CM

## 2023-05-09 LAB — PULMONARY FUNCTION TEST
DL/VA % pred: 85 %
DL/VA: 3.66 ml/min/mmHg/L
DLCO cor % pred: 68 %
DLCO cor: 22.09 ml/min/mmHg
DLCO unc % pred: 68 %
DLCO unc: 22.09 ml/min/mmHg
FEF 25-75 Post: 3.43 L/s
FEF 25-75 Pre: 4.02 L/s
FEF2575-%Change-Post: -14 %
FEF2575-%Pred-Post: 93 %
FEF2575-%Pred-Pre: 109 %
FEV1-%Change-Post: 3 %
FEV1-%Pred-Post: 79 %
FEV1-%Pred-Pre: 76 %
FEV1-Post: 3.43 L
FEV1-Pre: 3.32 L
FEV1FVC-%Change-Post: -4 %
FEV1FVC-%Pred-Pre: 102 %
FEV6-%Change-Post: 12 %
FEV6-%Pred-Post: 82 %
FEV6-%Pred-Pre: 73 %
FEV6-Post: 4.5 L
FEV6-Pre: 3.99 L
FEV6FVC-%Change-Post: -1 %
FEV6FVC-%Pred-Post: 102 %
FEV6FVC-%Pred-Pre: 103 %
FVC-%Change-Post: 7 %
FVC-%Pred-Post: 80 %
FVC-%Pred-Pre: 74 %
FVC-Post: 4.55 L
FVC-Pre: 4.22 L
Post FEV1/FVC ratio: 75 %
Post FEV6/FVC ratio: 99 %
Pre FEV1/FVC ratio: 79 %
Pre FEV6/FVC Ratio: 100 %
RV % pred: 86 %
RV: 2.03 L
TLC % pred: 86 %
TLC: 6.7 L

## 2023-05-09 MED ORDER — FLUTICASONE-SALMETEROL 230-21 MCG/ACT IN AERO: 2.0000 | INHALATION_SPRAY | Freq: Two times a day (BID) | RESPIRATORY_TRACT | 12 refills | Status: AC

## 2023-05-09 NOTE — Patient Instructions (Signed)
 Full PFT performed today.

## 2023-05-09 NOTE — Patient Instructions (Addendum)
 We will increase your advair inhaler dose - start advair 230-21mcg 2 puffs twice daily  Your breathing tests are normal except for the low diffusion capacity which is the same as the last test in 2023  Your CT chest shows improvements in the areas of inflammation from before  We will refer you to the ENT team for evaluation of Inspire device  Follow up in 1 year with pulmonary function tests

## 2023-05-09 NOTE — Progress Notes (Signed)
 Full PFT performed today.

## 2023-05-09 NOTE — Progress Notes (Signed)
 Synopsis: Referred in March 2023 for asthma, self referral  Subjective:   PATIENT ID: Zachary Delacruz GENDER: male DOB: 1970-10-25, MRN: [id number]  HPI  Chief Complaint  Patient presents with   Follow-up   Zachary Delacruz is a 53 year old male, never smoker with GERD, OSA and hypertension who returns to pulmonary clinic for shortness of breath.   He experiences intermittent exertional dyspnea, which can occur unexpectedly, such as when bending over to tie his shoes. Despite these symptoms, he was able to complete five scuba dives in [place] over the summer without any issues. He continues to use the Advair inhaler, but notes minimal improvement in his breathing.  Pulmonary function tests have shown stable results, with a mild abnormality in diffusion capacity that has not worsened. His FVC has increased from 3.86 liters to 4.55 liters, and his total lung capacity has increased from 5.59 liters to 6.7 liters. The diffusion capacity (DLCO) remains at 22, which is 68% of normal. A CT scan last fall showed improvement in previously inflamed areas of the lungs. An echocardiogram in July was normal.  He has a history of sleep apnea but does not use CPAP due to intolerance. His wife has noted that he stops breathing during sleep. He is aware that untreated sleep apnea can lead to pulmonary hypertension.  He recently had the flu, which caused a cough that has since resolved. No current cough.  OV 05/11/22 He continues to have shortness of breath and cough. The cough is mainly dry. He has not tolerated trelegy inhaler in the past due to oral irritation side effects.  09/22/21 HRCT Chest showed very mild fibrotic changes in the lung bases and scattered 1-29mm pulmonary nodules bilaterally.   Inflammatory lab evaluation is unremarkable.  No change in his symptoms.  OV 09/01/21 He started using fluticasone  nasal spray at last visit and PRN xopenex  for mild intermittent wheezing. He has used the inhaler  3-4 times since last visit. He reports his symptoms remain similar to last visit.  PFTs today show mild restriction and mild diffusion defect.  He does complain of diffuse joint aches that wax and wane, which has been over the last few years. He reports he can have joint pains involving his knees, hands and shoulders at various times.   No family history of autoimmune disease.   Initial OV 06/13/21 He reports being diagnosed with asthma a few years ago.  He has noticed shortness of breath when bending over to tie his shoes and also with exertional activities such as this cough.  He reports having to stop playing disc golf a couple times due to the shortness of breath.  He reports having to upper respiratory infections at least twice per year with significant sinus congestion and postnasal drainage.  Outside of these episodes of respiratory infections he has low baseline sinus congestion and drainage and intermittent shortness of breath.  Reports cough and wheezing during acute infections.  Denies any nighttime awakenings due to cough, shortness of breath or wheezing.  He was most recently treated for upper respiratory infection with prednisone  and azithromycin about 3 weeks ago.  He has been evaluated by Dr. Mardee [facility] previously and was trialed on Trelegy Ellipta  1 puff daily and albuterol inhaler as needed.  He has not taken Trelegy in the past year and he reports albuterol inhaler makes him feel anxious/jittery.  He has history of GERD which is well controlled on daily omeprazole.  He is a  never smoker but reports significant secondhand smoke from his parents in childhood.  His sister has asthma since childhood and is currently followed [facility].  His father had COPD/emphysema.  He works as an probation officer.  Past Medical History:  Diagnosis Date   GERD (gastroesophageal reflux disease)    Headache    Hyperlipemia    Hypertension    OSA (obstructive sleep  apnea)      Family History  Problem Relation Age of Onset   Healthy Mother    Cirrhosis Father        died at age 22   COPD Father    Asthma Sister    Premature CHD Neg Hx      Social History   Socioeconomic History   Marital status: Single    Spouse name: Not on file   Number of children: 4   Years of education: 12   Highest education level: High school graduate  Occupational History   Occupation: building surveyor  Tobacco Use   Smoking status: Never    Passive exposure: Past   Smokeless tobacco: Never  Substance and Sexual Activity   Alcohol use: Yes    Comment: rarely   Drug use: No   Sexual activity: Not on file  Other Topics Concern   Not on file  Social History Narrative   Lives at home with his significant other, Harlene Dunnings.   Right-handed.   One cup caffeine per day.   Social Drivers of Corporate Investment Banker Strain: Not on file  Food Insecurity: Not on file  Transportation Needs: Not on file  Physical Activity: Not on file  Stress: Not on file  Social Connections: Not on file  Intimate Partner Violence: Not on file     Allergies  Allergen Reactions   Lisinopril      Other reaction(s): Other (see comments) Cough   Metoclopramide Hcl Other (See Comments)    tachycardia     Outpatient Medications Prior to Visit  Medication Sig Dispense Refill   fluticasone  (FLONASE ) 50 MCG/ACT nasal spray Place 1 spray into both nostrils daily. 16 g 2   levalbuterol  (XOPENEX  HFA) 45 MCG/ACT inhaler Inhale 1-2 puffs into the lungs every 6 (six) hours as needed for wheezing. 1 each 12   olmesartan (BENICAR) 20 MG tablet Take 20 mg by mouth daily.     omeprazole (PRILOSEC) 20 MG capsule Take 20 mg by mouth daily.     Spacer/Aero-Holding Chambers (AEROCHAMBER MV) inhaler Use as instructed 1 each 0   fluticasone -salmeterol (ADVAIR HFA) 115-21 MCG/ACT inhaler Inhale 2 puffs into the lungs 2 (two) times daily. 1 each 12   No facility-administered medications prior  to visit.   Review of Systems  Constitutional:  Negative for chills, fever, malaise/fatigue and weight loss.  HENT:  Negative for congestion, sinus pain and sore throat.   Eyes: Negative.   Respiratory:  Positive for cough and shortness of breath. Negative for hemoptysis, sputum production and wheezing.   Cardiovascular:  Negative for chest pain, palpitations, orthopnea, claudication and leg swelling.  Gastrointestinal:  Positive for heartburn. Negative for abdominal pain, nausea and vomiting.  Genitourinary: Negative.   Musculoskeletal:  Negative for joint pain and myalgias.  Skin:  Negative for rash.  Neurological:  Negative for weakness.  Endo/Heme/Allergies: Negative.   Psychiatric/Behavioral: Negative.      Objective:   Vitals:   05/09/23 1531  BP: 119/68  Pulse: 84  SpO2: 96%  Weight: 235 lb (106.6 kg)  Height:  6' 2 (1.88 m)    Physical Exam Constitutional:      General: He is not in acute distress. HENT:     Head: Normocephalic and atraumatic.  Eyes:     Conjunctiva/sclera: Conjunctivae normal.  Cardiovascular:     Rate and Rhythm: Normal rate and regular rhythm.     Pulses: Normal pulses.     Heart sounds: Normal heart sounds. No murmur heard. Pulmonary:     Effort: Pulmonary effort is normal.     Breath sounds: Decreased air movement present. No wheezing, rhonchi or rales.  Musculoskeletal:     Right lower leg: No edema.     Left lower leg: No edema.  Skin:    General: Skin is warm and dry.  Neurological:     General: No focal deficit present.     Mental Status: He is alert.    CBC    Component Value Date/Time   WBC 8.4 12/12/2016 1830   RBC 5.15 12/12/2016 1830   HGB 16.5 12/12/2016 1830   HCT 45.8 12/12/2016 1830   PLT 150 12/12/2016 1830   MCV 88.9 12/12/2016 1830   MCH 32.0 12/12/2016 1830   MCHC 36.0 12/12/2016 1830   RDW 13.2 12/12/2016 1830   Chest imaging: HRCT Chest 01/09/23 1. No evidence of fibrotic interstitial lung disease. 2.  Some previously described areas of septal thickening have resolved. Persistent mild focal fibrosis in the right lower lobe which is likely due to postinfectious scarring. 3. Mild aortic Atherosclerosis (ICD10-I70.0).  HRCT Chest 09/13/21 1. Extremely mild fibrotic changes in the lung bases, which could simply reflect areas of mild chronic post infectious or inflammatory scarring. Early interstitial lung disease is not excluded, but not strongly favored, and findings on today's examination would be categorized as indeterminate for usual interstitial pneumonia (UIP) per current ATS guidelines. If there is persistent clinical concern for interstitial lung disease, repeat high-resolution chest CT should be considered in 12-24 months to assess for temporal changes in the appearance of the lung parenchyma. 2. Scattered tiny 1-3 mm pulmonary nodules in the lungs bilaterally, nonspecific, but statistically likely benign. No follow-up needed if patient is low-risk (and has no known or suspected primary neoplasm). Non-contrast chest CT can be considered in 12 months if patient is high-risk. This recommendation follows the consensus statement: Guidelines for Management of Incidental Pulmonary Nodules Detected on CT Images: From the Fleischner Society 2017; Radiology 2017; 284:228-243. 3. Aortic atherosclerosis.  CXR 03/2015 No evidence of acute cardiopulmonary disease.  PFT:    Latest Ref Rng & Units 05/09/2023    1:44 PM 09/01/2021   12:46 PM  PFT Results  FVC-Pre L 4.22  P 3.90   FVC-Predicted Pre % 74  P 68   FVC-Post L 4.55  P 3.86   FVC-Predicted Post % 80  P 67   Pre FEV1/FVC % % 79  P 84   Post FEV1/FCV % % 75  P 87   FEV1-Pre L 3.32  P 3.29   FEV1-Predicted Pre % 76  P 74   FEV1-Post L 3.43  P 3.36   DLCO uncorrected ml/min/mmHg 22.09  P 22.50   DLCO UNC% % 68  P 68   DLCO corrected ml/min/mmHg 22.09  P 22.50   DLCO COR %Predicted % 68  P 68   DLVA Predicted % 85  P 88   TLC L  6.70  P 5.59   TLC % Predicted % 86  P 71   RV %  Predicted % 86  P 61     P Preliminary result    Labs:  Path:  Echo:  Heart Catheterization:  Assessment & Plan:   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   He has mild diffusion defect which is stable on PFTs. No restriction noted on today's PFTs like before.   HRCT Chest shows improved subpleural reticulation.   We reviewed these results together and are reassuring to not see any progressive worsening on radiographic findings or PFT montioring.  Will increase his advair inhaler dose to see if he gets any further relief from the inhaler.   Echo 2023 is within normal limits, no concern for elevated PASP.   Will refer to ENT for evaluation of Inspire device for his history of sleep apnea.  Follow up in 1 year with pulmonary function tests.  Zachary Chill, MD [place] [facility] Office: [phone]   Current Outpatient Medications:    fluticasone  (FLONASE ) 50 MCG/ACT nasal spray, Place 1 spray into both nostrils daily., Disp: 16 g, Rfl: 2   fluticasone -salmeterol (ADVAIR HFA) 230-21 MCG/ACT inhaler, Inhale 2 puffs into the lungs 2 (two) times daily., Disp: 1 each, Rfl: 12   levalbuterol  (XOPENEX  HFA) 45 MCG/ACT inhaler, Inhale 1-2 puffs into the lungs every 6 (six) hours as needed for wheezing., Disp: 1 each, Rfl: 12   olmesartan (BENICAR) 20 MG tablet, Take 20 mg by mouth daily., Disp: , Rfl:    omeprazole (PRILOSEC) 20 MG capsule, Take 20 mg by mouth daily., Disp: , Rfl:    Spacer/Aero-Holding Chambers  (AEROCHAMBER MV) inhaler, Use as instructed, Disp: 1 each, Rfl: 0

## 2023-05-10 ENCOUNTER — Encounter: Payer: Self-pay | Admitting: Pulmonary Disease

## 2023-12-09 ENCOUNTER — Encounter: Payer: Self-pay | Admitting: Gastroenterology

## 2024-01-29 ENCOUNTER — Ambulatory Visit: Admitting: Gastroenterology

## 2024-01-29 ENCOUNTER — Encounter: Payer: Self-pay | Admitting: Gastroenterology

## 2024-01-29 VITALS — BP 124/78 | HR 96 | Ht 72.5 in | Wt 237.5 lb

## 2024-01-29 DIAGNOSIS — K760 Fatty (change of) liver, not elsewhere classified: Secondary | ICD-10-CM

## 2024-01-29 DIAGNOSIS — R109 Unspecified abdominal pain: Secondary | ICD-10-CM

## 2024-01-29 DIAGNOSIS — K9089 Other intestinal malabsorption: Secondary | ICD-10-CM

## 2024-01-29 DIAGNOSIS — Z860100 Personal history of colon polyps, unspecified: Secondary | ICD-10-CM

## 2024-01-29 DIAGNOSIS — D696 Thrombocytopenia, unspecified: Secondary | ICD-10-CM

## 2024-01-29 DIAGNOSIS — K219 Gastro-esophageal reflux disease without esophagitis: Secondary | ICD-10-CM

## 2024-01-29 DIAGNOSIS — R152 Fecal urgency: Secondary | ICD-10-CM

## 2024-01-29 DIAGNOSIS — R7989 Other specified abnormal findings of blood chemistry: Secondary | ICD-10-CM

## 2024-01-29 MED ORDER — CHOLESTYRAMINE 4 G PO PACK: 4.0000 g | PACK | Freq: Every day | ORAL | 0 refills | Status: AC

## 2024-01-29 MED ORDER — NA SULFATE-K SULFATE-MG SULF 17.5-3.13-1.6 GM/177ML PO SOLN: 1.0000 | Freq: Once | ORAL | 0 refills | Status: AC
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
## 2024-01-29 NOTE — Patient Instructions (Addendum)
 Diarrhea with urgency Recommend low fodmap diet Cholesytramine 4gm packet at bedtime  Fatty liver Recommend Mediterranean diet Exercise as tolerated Maintain healthy weight   GERD GERD diet Continue omeprazole   You have been scheduled for an abdominal ultrasound at [facility] (1st floor of hospital) on 02/03/24 at 10:15am. Please arrive 30 minutes prior to your appointment for registration. Make certain not to have anything to eat or drink after midnight prior to your appointment. Should you need to reschedule your appointment, please contact radiology at [phone]. This test typically takes about 30 minutes to perform.  You have been scheduled for an endoscopy and colonoscopy. Please follow the written instructions given to you at your visit today.  If you use inhalers (even only as needed), please bring them with you on the day of your procedure.  DO NOT TAKE 7 DAYS PRIOR TO TEST- Trulicity (dulaglutide) Ozempic, Wegovy (semaglutide) Mounjaro (tirzepatide) Bydureon Bcise (exanatide extended release)  DO NOT TAKE 1 DAY PRIOR TO YOUR TEST Rybelsus (semaglutide) Adlyxin (lixisenatide) Victoza (liraglutide) Byetta (exanatide) ___________________________________________________________________________ Due to recent changes in healthcare laws, you may see the results of your imaging and laboratory studies on MyChart before your provider has had a chance to review them.  We understand that in some cases there may be results that are confusing or concerning to you. Not all laboratory results come back in the same time frame and the provider may be waiting for multiple results in order to interpret others.  Please give us  48 hours in order for your provider to thoroughly review all the results before contacting the office for clarification of your results.   _______________________________________________________  If your blood pressure at your visit was 140/90 or greater,  please contact your primary care physician to follow up on this.  _______________________________________________________  If you are age 55 or older, your body mass index should be between 23-30. Your Body mass index is 31.77 kg/m. If this is out of the aforementioned range listed, please consider follow up with your Primary Care Provider.  If you are age 55 or younger, your body mass index should be between 19-25. Your Body mass index is 31.77 kg/m. If this is out of the aformentioned range listed, please consider follow up with your Primary Care Provider.   ________________________________________________________  The [facility] providers would like to encourage you to use MYCHART to communicate with providers for non-urgent requests or questions.  Due to long hold times on the telephone, sending your provider a message by [facility] may be a faster and more efficient way to get a response.  Please allow 48 business hours for a response.  Please remember that this is for non-urgent requests.  _______________________________________________________  [facility] is using a team-based approach to care.  Your team is made up of your doctor and two to three APPS. Our APPS (Nurse Practitioners and Physician Assistants) work with your physician to ensure care continuity for you. They are fully qualified to address your health concerns and develop a treatment plan. They communicate directly with your gastroenterologist to care for you. Seeing the Advanced Practice Practitioners on your physician's team can help you by facilitating care more promptly, often allowing for earlier appointments, access to diagnostic testing, procedures, and other specialty referrals.   Thank you for trusting me with your gastrointestinal care. Deanna May, FNP-C

## 2024-01-30 ENCOUNTER — Telehealth: Payer: Self-pay | Admitting: Gastroenterology

## 2024-01-30 NOTE — Telephone Encounter (Signed)
 Left message for patient to call back

## 2024-01-30 NOTE — Telephone Encounter (Signed)
 Inbound call from patient stated that he is not in network with his insurance and has to cancel his imaging appointment. Please advise.

## 2024-01-31 NOTE — Telephone Encounter (Signed)
 Cancelled US  for 11/3. Patient is going to call us  back when he finds a new location that is in network & we can send order.

## 2024-02-03 ENCOUNTER — Inpatient Hospital Stay (HOSPITAL_BASED_OUTPATIENT_CLINIC_OR_DEPARTMENT_OTHER): Admission: RE | Admit: 2024-02-03 | Source: Ambulatory Visit | Admitting: Radiology

## 2024-03-11 ENCOUNTER — Encounter: Admitting: Gastroenterology
